# Patient Record
Sex: Female | Born: 1935 | Race: White | Hispanic: No | Marital: Married | Smoking: Never smoker
Health system: Southern US, Community
[De-identification: ages and names within clinical notes are randomized; demographics above are authoritative.]

## PROBLEM LIST (undated history)

## (undated) DIAGNOSIS — I2510 Atherosclerotic heart disease of native coronary artery without angina pectoris: Secondary | ICD-10-CM

## (undated) DIAGNOSIS — C801 Malignant (primary) neoplasm, unspecified: Secondary | ICD-10-CM

## (undated) HISTORY — PX: ABDOMINAL HYSTERECTOMY: SHX81

## (undated) HISTORY — PX: TONSILLECTOMY: SUR1361

## (undated) HISTORY — PX: MASTECTOMY: SHX3

## (undated) HISTORY — PX: APPENDECTOMY: SHX54

## (undated) HISTORY — PX: CORONARY ANGIOPLASTY: SHX604

---

## 1998-04-30 ENCOUNTER — Ambulatory Visit (HOSPITAL_BASED_OUTPATIENT_CLINIC_OR_DEPARTMENT_OTHER): Admission: RE | Admit: 1998-04-30 | Discharge: 1998-04-30 | Payer: Self-pay | Admitting: Plastic Surgery

## 2011-09-10 DIAGNOSIS — I10 Essential (primary) hypertension: Secondary | ICD-10-CM | POA: Diagnosis not present

## 2011-09-10 DIAGNOSIS — C50919 Malignant neoplasm of unspecified site of unspecified female breast: Secondary | ICD-10-CM | POA: Diagnosis not present

## 2011-09-10 DIAGNOSIS — E782 Mixed hyperlipidemia: Secondary | ICD-10-CM | POA: Diagnosis not present

## 2011-09-10 DIAGNOSIS — I2510 Atherosclerotic heart disease of native coronary artery without angina pectoris: Secondary | ICD-10-CM | POA: Diagnosis not present

## 2011-09-30 DIAGNOSIS — Z79899 Other long term (current) drug therapy: Secondary | ICD-10-CM | POA: Diagnosis not present

## 2011-09-30 DIAGNOSIS — I2510 Atherosclerotic heart disease of native coronary artery without angina pectoris: Secondary | ICD-10-CM | POA: Diagnosis not present

## 2011-09-30 DIAGNOSIS — L989 Disorder of the skin and subcutaneous tissue, unspecified: Secondary | ICD-10-CM | POA: Diagnosis not present

## 2011-09-30 DIAGNOSIS — Z9861 Coronary angioplasty status: Secondary | ICD-10-CM | POA: Diagnosis not present

## 2011-09-30 DIAGNOSIS — I10 Essential (primary) hypertension: Secondary | ICD-10-CM | POA: Diagnosis not present

## 2011-09-30 DIAGNOSIS — M810 Age-related osteoporosis without current pathological fracture: Secondary | ICD-10-CM | POA: Diagnosis not present

## 2011-09-30 DIAGNOSIS — Z853 Personal history of malignant neoplasm of breast: Secondary | ICD-10-CM | POA: Diagnosis not present

## 2011-09-30 DIAGNOSIS — Z09 Encounter for follow-up examination after completed treatment for conditions other than malignant neoplasm: Secondary | ICD-10-CM | POA: Diagnosis not present

## 2011-09-30 DIAGNOSIS — E785 Hyperlipidemia, unspecified: Secondary | ICD-10-CM | POA: Diagnosis not present

## 2011-09-30 DIAGNOSIS — Z7982 Long term (current) use of aspirin: Secondary | ICD-10-CM | POA: Diagnosis not present

## 2011-10-13 DIAGNOSIS — L821 Other seborrheic keratosis: Secondary | ICD-10-CM | POA: Diagnosis not present

## 2011-10-13 DIAGNOSIS — D485 Neoplasm of uncertain behavior of skin: Secondary | ICD-10-CM | POA: Diagnosis not present

## 2011-10-13 DIAGNOSIS — L820 Inflamed seborrheic keratosis: Secondary | ICD-10-CM | POA: Diagnosis not present

## 2011-12-27 DIAGNOSIS — I10 Essential (primary) hypertension: Secondary | ICD-10-CM | POA: Diagnosis not present

## 2011-12-27 DIAGNOSIS — E782 Mixed hyperlipidemia: Secondary | ICD-10-CM | POA: Diagnosis not present

## 2012-03-21 DIAGNOSIS — L989 Disorder of the skin and subcutaneous tissue, unspecified: Secondary | ICD-10-CM | POA: Diagnosis not present

## 2012-03-21 DIAGNOSIS — Z9889 Other specified postprocedural states: Secondary | ICD-10-CM | POA: Diagnosis not present

## 2012-03-21 DIAGNOSIS — Z853 Personal history of malignant neoplasm of breast: Secondary | ICD-10-CM | POA: Diagnosis not present

## 2012-03-21 DIAGNOSIS — E785 Hyperlipidemia, unspecified: Secondary | ICD-10-CM | POA: Diagnosis not present

## 2012-03-21 DIAGNOSIS — I2510 Atherosclerotic heart disease of native coronary artery without angina pectoris: Secondary | ICD-10-CM | POA: Diagnosis not present

## 2012-03-21 DIAGNOSIS — M810 Age-related osteoporosis without current pathological fracture: Secondary | ICD-10-CM | POA: Diagnosis not present

## 2012-03-21 DIAGNOSIS — I10 Essential (primary) hypertension: Secondary | ICD-10-CM | POA: Diagnosis not present

## 2012-03-21 DIAGNOSIS — R928 Other abnormal and inconclusive findings on diagnostic imaging of breast: Secondary | ICD-10-CM | POA: Diagnosis not present

## 2012-03-21 DIAGNOSIS — Z9861 Coronary angioplasty status: Secondary | ICD-10-CM | POA: Diagnosis not present

## 2012-03-28 ENCOUNTER — Encounter: Payer: Self-pay | Admitting: Hematology and Oncology

## 2012-03-28 DIAGNOSIS — Z170 Estrogen receptor positive status [ER+]: Secondary | ICD-10-CM

## 2012-03-28 DIAGNOSIS — M810 Age-related osteoporosis without current pathological fracture: Secondary | ICD-10-CM

## 2012-03-28 DIAGNOSIS — I10 Essential (primary) hypertension: Secondary | ICD-10-CM | POA: Diagnosis not present

## 2012-03-28 DIAGNOSIS — C50919 Malignant neoplasm of unspecified site of unspecified female breast: Secondary | ICD-10-CM

## 2012-03-30 DIAGNOSIS — I10 Essential (primary) hypertension: Secondary | ICD-10-CM | POA: Diagnosis not present

## 2012-05-15 DIAGNOSIS — Z23 Encounter for immunization: Secondary | ICD-10-CM | POA: Diagnosis not present

## 2012-10-10 DIAGNOSIS — E782 Mixed hyperlipidemia: Secondary | ICD-10-CM | POA: Diagnosis not present

## 2012-10-10 DIAGNOSIS — I10 Essential (primary) hypertension: Secondary | ICD-10-CM | POA: Diagnosis not present

## 2012-10-25 ENCOUNTER — Encounter: Payer: Medicare Other | Admitting: Internal Medicine

## 2012-10-25 DIAGNOSIS — M810 Age-related osteoporosis without current pathological fracture: Secondary | ICD-10-CM | POA: Diagnosis not present

## 2012-10-25 DIAGNOSIS — C50919 Malignant neoplasm of unspecified site of unspecified female breast: Secondary | ICD-10-CM | POA: Diagnosis not present

## 2013-01-09 DIAGNOSIS — I10 Essential (primary) hypertension: Secondary | ICD-10-CM | POA: Diagnosis not present

## 2013-04-12 DIAGNOSIS — Z0000 Encounter for general adult medical examination without abnormal findings: Secondary | ICD-10-CM | POA: Diagnosis not present

## 2013-04-12 DIAGNOSIS — I10 Essential (primary) hypertension: Secondary | ICD-10-CM | POA: Diagnosis not present

## 2013-05-04 DIAGNOSIS — Z23 Encounter for immunization: Secondary | ICD-10-CM | POA: Diagnosis not present

## 2013-07-02 DIAGNOSIS — M810 Age-related osteoporosis without current pathological fracture: Secondary | ICD-10-CM | POA: Diagnosis not present

## 2013-07-02 DIAGNOSIS — Z780 Asymptomatic menopausal state: Secondary | ICD-10-CM | POA: Diagnosis not present

## 2013-07-02 DIAGNOSIS — C50919 Malignant neoplasm of unspecified site of unspecified female breast: Secondary | ICD-10-CM | POA: Diagnosis not present

## 2013-07-02 DIAGNOSIS — Z7982 Long term (current) use of aspirin: Secondary | ICD-10-CM | POA: Diagnosis not present

## 2013-07-02 DIAGNOSIS — Z79899 Other long term (current) drug therapy: Secondary | ICD-10-CM | POA: Diagnosis not present

## 2013-08-10 DIAGNOSIS — I10 Essential (primary) hypertension: Secondary | ICD-10-CM | POA: Diagnosis not present

## 2013-11-08 DIAGNOSIS — I10 Essential (primary) hypertension: Secondary | ICD-10-CM | POA: Diagnosis not present

## 2014-01-01 DIAGNOSIS — H2510 Age-related nuclear cataract, unspecified eye: Secondary | ICD-10-CM | POA: Diagnosis not present

## 2014-01-01 DIAGNOSIS — H5210 Myopia, unspecified eye: Secondary | ICD-10-CM | POA: Diagnosis not present

## 2014-01-01 DIAGNOSIS — H40019 Open angle with borderline findings, low risk, unspecified eye: Secondary | ICD-10-CM | POA: Diagnosis not present

## 2014-01-01 DIAGNOSIS — H524 Presbyopia: Secondary | ICD-10-CM | POA: Diagnosis not present

## 2014-02-11 DIAGNOSIS — I10 Essential (primary) hypertension: Secondary | ICD-10-CM | POA: Diagnosis not present

## 2014-02-11 DIAGNOSIS — Z681 Body mass index (BMI) 19 or less, adult: Secondary | ICD-10-CM | POA: Diagnosis not present

## 2014-05-07 DIAGNOSIS — Z23 Encounter for immunization: Secondary | ICD-10-CM | POA: Diagnosis not present

## 2014-05-14 DIAGNOSIS — I10 Essential (primary) hypertension: Secondary | ICD-10-CM | POA: Diagnosis not present

## 2014-05-14 DIAGNOSIS — Z1389 Encounter for screening for other disorder: Secondary | ICD-10-CM | POA: Diagnosis not present

## 2014-05-14 DIAGNOSIS — Z0000 Encounter for general adult medical examination without abnormal findings: Secondary | ICD-10-CM | POA: Diagnosis not present

## 2014-06-21 DIAGNOSIS — T1490 Injury, unspecified: Secondary | ICD-10-CM | POA: Diagnosis not present

## 2014-06-21 DIAGNOSIS — S0181XA Laceration without foreign body of other part of head, initial encounter: Secondary | ICD-10-CM | POA: Diagnosis not present

## 2014-06-21 DIAGNOSIS — Z8589 Personal history of malignant neoplasm of other organs and systems: Secondary | ICD-10-CM | POA: Diagnosis not present

## 2014-06-21 DIAGNOSIS — I2510 Atherosclerotic heart disease of native coronary artery without angina pectoris: Secondary | ICD-10-CM | POA: Diagnosis not present

## 2014-06-21 DIAGNOSIS — Z9889 Other specified postprocedural states: Secondary | ICD-10-CM | POA: Diagnosis not present

## 2014-06-21 DIAGNOSIS — R51 Headache: Secondary | ICD-10-CM | POA: Diagnosis not present

## 2014-06-21 DIAGNOSIS — R220 Localized swelling, mass and lump, head: Secondary | ICD-10-CM | POA: Diagnosis not present

## 2014-06-21 DIAGNOSIS — W1789XA Other fall from one level to another, initial encounter: Secondary | ICD-10-CM | POA: Diagnosis not present

## 2014-06-21 DIAGNOSIS — Z9861 Coronary angioplasty status: Secondary | ICD-10-CM | POA: Diagnosis not present

## 2014-06-21 DIAGNOSIS — S199XXA Unspecified injury of neck, initial encounter: Secondary | ICD-10-CM | POA: Diagnosis not present

## 2014-06-21 DIAGNOSIS — I10 Essential (primary) hypertension: Secondary | ICD-10-CM | POA: Diagnosis not present

## 2014-06-21 DIAGNOSIS — S0190XA Unspecified open wound of unspecified part of head, initial encounter: Secondary | ICD-10-CM | POA: Diagnosis not present

## 2014-06-21 DIAGNOSIS — M542 Cervicalgia: Secondary | ICD-10-CM | POA: Diagnosis not present

## 2014-07-02 DIAGNOSIS — H40013 Open angle with borderline findings, low risk, bilateral: Secondary | ICD-10-CM | POA: Diagnosis not present

## 2014-07-02 DIAGNOSIS — H2513 Age-related nuclear cataract, bilateral: Secondary | ICD-10-CM | POA: Diagnosis not present

## 2014-07-02 DIAGNOSIS — H3531 Nonexudative age-related macular degeneration: Secondary | ICD-10-CM | POA: Diagnosis not present

## 2014-07-02 DIAGNOSIS — H524 Presbyopia: Secondary | ICD-10-CM | POA: Diagnosis not present

## 2014-09-12 DIAGNOSIS — I10 Essential (primary) hypertension: Secondary | ICD-10-CM | POA: Diagnosis not present

## 2014-09-12 DIAGNOSIS — Z131 Encounter for screening for diabetes mellitus: Secondary | ICD-10-CM | POA: Diagnosis not present

## 2014-09-13 DIAGNOSIS — H40013 Open angle with borderline findings, low risk, bilateral: Secondary | ICD-10-CM | POA: Diagnosis not present

## 2014-09-13 DIAGNOSIS — H2513 Age-related nuclear cataract, bilateral: Secondary | ICD-10-CM | POA: Diagnosis not present

## 2014-10-13 DIAGNOSIS — H25812 Combined forms of age-related cataract, left eye: Secondary | ICD-10-CM | POA: Diagnosis not present

## 2014-10-21 DIAGNOSIS — H40013 Open angle with borderline findings, low risk, bilateral: Secondary | ICD-10-CM | POA: Diagnosis not present

## 2014-10-21 DIAGNOSIS — H25812 Combined forms of age-related cataract, left eye: Secondary | ICD-10-CM | POA: Diagnosis not present

## 2014-10-21 DIAGNOSIS — H25813 Combined forms of age-related cataract, bilateral: Secondary | ICD-10-CM | POA: Diagnosis not present

## 2014-11-13 ENCOUNTER — Encounter (HOSPITAL_COMMUNITY)
Admission: RE | Admit: 2014-11-13 | Discharge: 2014-11-13 | Disposition: A | Discharge status: Home / Self Care | Payer: Medicare Other | Source: Ambulatory Visit | Attending: Ophthalmology | Admitting: Ophthalmology

## 2014-11-13 ENCOUNTER — Encounter (HOSPITAL_COMMUNITY): Payer: Self-pay

## 2014-11-13 ENCOUNTER — Other Ambulatory Visit: Payer: Self-pay

## 2014-11-13 DIAGNOSIS — Z01812 Encounter for preprocedural laboratory examination: Secondary | ICD-10-CM | POA: Insufficient documentation

## 2014-11-13 DIAGNOSIS — Z01810 Encounter for preprocedural cardiovascular examination: Secondary | ICD-10-CM | POA: Diagnosis not present

## 2014-11-13 HISTORY — DX: Malignant (primary) neoplasm, unspecified: C80.1

## 2014-11-13 HISTORY — DX: Atherosclerotic heart disease of native coronary artery without angina pectoris: I25.10

## 2014-11-13 LAB — CBC
HEMATOCRIT: 32.8 % — AB (ref 36.0–46.0)
Hemoglobin: 10.6 g/dL — ABNORMAL LOW (ref 12.0–15.0)
MCH: 30.2 pg (ref 26.0–34.0)
MCHC: 32.3 g/dL (ref 30.0–36.0)
MCV: 93.4 fL (ref 78.0–100.0)
Platelets: 249 10*3/uL (ref 150–400)
RBC: 3.51 MIL/uL — AB (ref 3.87–5.11)
RDW: 13.5 % (ref 11.5–15.5)
WBC: 8.6 10*3/uL (ref 4.0–10.5)

## 2014-11-13 LAB — BASIC METABOLIC PANEL
Anion gap: 6 (ref 5–15)
BUN: 13 mg/dL (ref 6–23)
CALCIUM: 9.2 mg/dL (ref 8.4–10.5)
CHLORIDE: 102 mmol/L (ref 96–112)
CO2: 31 mmol/L (ref 19–32)
Creatinine, Ser: 0.68 mg/dL (ref 0.50–1.10)
GFR calc non Af Amer: 74 mL/min — ABNORMAL LOW (ref >90)
GFR, EST AFRICAN AMERICAN: 86 mL/min — AB (ref >90)
GLUCOSE: 100 mg/dL — AB (ref 70–99)
POTASSIUM: 4.8 mmol/L (ref 3.5–5.1)
Sodium: 139 mmol/L (ref 135–145)

## 2014-11-13 NOTE — Patient Instructions (Signed)
[place]  11/13/2014   Your procedure is scheduled on:  11/18/2014  Report to [facility] at 6:15 AM.  Call this number if you have problems the morning of surgery: [phone]   Remember:   Do not eat food or drink liquids after midnight.   Take these medicines the morning of surgery with A SIP OF WATER: Vasotec, Metoprolol, Tamoxifen   Do not wear jewelry, make-up or nail polish.  Do not wear lotions, powders, or perfumes. You may wear deodorant.  Do not shave 48 hours prior to surgery. Men may shave face and neck.  Do not bring valuables to the hospital.  [facility] is not responsible for any belongings or valuables.               Contacts, dentures or bridgework may not be worn into surgery.  Leave suitcase in the car. After surgery it may be brought to your room.  For patients admitted to the hospital, discharge time is determined by your treatment team.               Patients discharged the day of surgery will not be allowed to drive home.  Name and phone number of your driver:   Special Instructions: N/A   Please read over the following fact sheets that you were given: Anesthesia Post-op Instructions   PATIENT INSTRUCTIONS POST-ANESTHESIA  IMMEDIATELY FOLLOWING SURGERY:  Do not drive or operate machinery for the first twenty four hours after surgery.  Do not make any important decisions for twenty four hours after surgery or while taking narcotic pain medications or sedatives.  If you develop intractable nausea and vomiting or a severe headache please notify your doctor immediately.  FOLLOW-UP:  Please make an appointment with your surgeon as instructed. You do not need to follow up with anesthesia unless specifically instructed to do so.  WOUND CARE INSTRUCTIONS (if applicable):  Keep a dry clean dressing on the anesthesia/puncture wound site if there is drainage.  Once the wound has quit draining you may leave it open to air.  Generally you should leave the  bandage intact for twenty four hours unless there is drainage.  If the epidural site drains for more than 36-48 hours please call the anesthesia department.  QUESTIONS?:  Please feel free to call your physician or the hospital operator if you have any questions, and they will be happy to assist you.      Cataract Surgery  A cataract is a clouding of the lens of the eye. When a lens becomes cloudy, vision is reduced based on the degree and nature of the clouding. Surgery may be needed to improve vision. Surgery removes the cloudy lens and usually replaces it with a substitute lens (intraocular lens, IOL). LET YOUR EYE DOCTOR KNOW ABOUT:  Allergies to food or medicine.  Medicines taken including herbs, eye drops, over-the-counter medicines, and creams.  Use of steroids (by mouth or creams).  Previous problems with anesthetics or numbing medicine.  History of bleeding problems or blood clots.  Previous surgery.  Other health problems, including diabetes and kidney problems.  Possibility of pregnancy, if this applies. RISKS AND COMPLICATIONS  Infection.  Inflammation of the eyeball (endophthalmitis) that can spread to both eyes (sympathetic ophthalmia).  Poor wound healing.  If an IOL is inserted, it can later fall out of proper position. This is very uncommon.  Clouding of the part of your eye that holds an IOL in place. This is called an "  after-cataract." These are uncommon but easily treated. BEFORE THE PROCEDURE  Do not eat or drink anything except small amounts of water for 8 to 12 before your surgery, or as directed by your caregiver.  Unless you are told otherwise, continue any eye drops you have been prescribed.  Talk to your primary caregiver about all other medicines that you take (both prescription and nonprescription). In some cases, you may need to stop or change medicines near the time of your surgery. This is most important if you are taking blood-thinning  medicine.Do not stop medicines unless you are told to do so.  Arrange for someone to drive you to and from the procedure.  Do not put contact lenses in either eye on the day of your surgery. PROCEDURE There is more than one method for safely removing a cataract. Your doctor can explain the differences and help determine which is best for you. Phacoemulsification surgery is the most common form of cataract surgery.  An injection is given behind the eye or eye drops are given to make this a painless procedure.  A small cut (incision) is made on the edge of the clear, dome-shaped surface that covers the front of the eye (cornea).  A tiny probe is painlessly inserted into the eye. This device gives off ultrasound waves that soften and break up the cloudy center of the lens. This makes it easier for the cloudy lens to be removed by suction.  An IOL may be implanted.  The normal lens of the eye is covered by a clear capsule. Part of that capsule is intentionally left in the eye to support the IOL.  Your surgeon may or may not use stitches to close the incision. There are other forms of cataract surgery that require a larger incision and stitches to close the eye. This approach is taken in cases where the doctor feels that the cataract cannot be easily removed using phacoemulsification. AFTER THE PROCEDURE  When an IOL is implanted, it does not need care. It becomes a permanent part of your eye and cannot be seen or felt.  Your doctor will schedule follow-up exams to check on your progress.  Review your other medicines with your doctor to see which can be resumed after surgery.  Use eye drops or take medicine as prescribed by your doctor. Document Released: 07/01/2011 Document Revised: 11/26/2013 Document Reviewed: 07/01/2011 [facility] Patient Information 2015 [place], [place]. This information is not intended to replace advice given to you by your health care provider. Make sure you discuss  any questions you have with your health care provider.

## 2014-11-15 MED ORDER — NEOMYCIN-POLYMYXIN-DEXAMETH 3.5-10000-0.1 OP SUSP
OPHTHALMIC | Status: AC
  Filled 2014-11-15: qty 5

## 2014-11-15 MED ORDER — CYCLOPENTOLATE-PHENYLEPHRINE OP SOLN OPTIME - NO CHARGE
OPHTHALMIC | Status: AC
  Filled 2014-11-15: qty 2

## 2014-11-15 MED ORDER — KETOROLAC TROMETHAMINE 0.5 % OP SOLN: 1.0000 [drp] | OPHTHALMIC | Status: AC

## 2014-11-15 MED ORDER — PHENYLEPHRINE HCL 2.5 % OP SOLN
OPHTHALMIC | Status: AC
  Filled 2014-11-15: qty 15

## 2014-11-15 MED ORDER — LIDOCAINE HCL 3.5 % OP GEL
OPHTHALMIC | Status: AC
  Filled 2014-11-15: qty 1

## 2014-11-15 MED ORDER — LIDOCAINE HCL (PF) 1 % IJ SOLN
INTRAMUSCULAR | Status: AC
  Filled 2014-11-15: qty 2

## 2014-11-15 MED ORDER — TETRACAINE HCL 0.5 % OP SOLN
OPHTHALMIC | Status: AC
  Filled 2014-11-15: qty 2

## 2014-11-18 ENCOUNTER — Ambulatory Visit (HOSPITAL_COMMUNITY): Payer: Medicare Other | Admitting: Anesthesiology

## 2014-11-18 ENCOUNTER — Encounter (HOSPITAL_COMMUNITY): Payer: Self-pay | Admitting: *Deleted

## 2014-11-18 ENCOUNTER — Encounter (HOSPITAL_COMMUNITY)
Admission: RE | Disposition: A | Discharge status: Home / Self Care | Payer: Self-pay | Source: Ambulatory Visit | Attending: Ophthalmology

## 2014-11-18 ENCOUNTER — Ambulatory Visit (HOSPITAL_COMMUNITY)
Admission: RE | Admit: 2014-11-18 | Discharge: 2014-11-18 | Disposition: A | Discharge status: Home / Self Care | Payer: Medicare Other | Source: Ambulatory Visit | Attending: Ophthalmology | Admitting: Ophthalmology

## 2014-11-18 DIAGNOSIS — Z7982 Long term (current) use of aspirin: Secondary | ICD-10-CM | POA: Insufficient documentation

## 2014-11-18 DIAGNOSIS — Z79899 Other long term (current) drug therapy: Secondary | ICD-10-CM | POA: Diagnosis not present

## 2014-11-18 DIAGNOSIS — H25812 Combined forms of age-related cataract, left eye: Secondary | ICD-10-CM | POA: Insufficient documentation

## 2014-11-18 DIAGNOSIS — Z791 Long term (current) use of non-steroidal anti-inflammatories (NSAID): Secondary | ICD-10-CM | POA: Insufficient documentation

## 2014-11-18 DIAGNOSIS — H269 Unspecified cataract: Secondary | ICD-10-CM | POA: Diagnosis present

## 2014-11-18 DIAGNOSIS — H9190 Unspecified hearing loss, unspecified ear: Secondary | ICD-10-CM | POA: Diagnosis not present

## 2014-11-18 DIAGNOSIS — I2510 Atherosclerotic heart disease of native coronary artery without angina pectoris: Secondary | ICD-10-CM | POA: Diagnosis not present

## 2014-11-18 DIAGNOSIS — Z853 Personal history of malignant neoplasm of breast: Secondary | ICD-10-CM | POA: Insufficient documentation

## 2014-11-18 DIAGNOSIS — H259 Unspecified age-related cataract: Secondary | ICD-10-CM | POA: Diagnosis not present

## 2014-11-18 DIAGNOSIS — Z955 Presence of coronary angioplasty implant and graft: Secondary | ICD-10-CM | POA: Insufficient documentation

## 2014-11-18 DIAGNOSIS — I10 Essential (primary) hypertension: Secondary | ICD-10-CM | POA: Insufficient documentation

## 2014-11-18 HISTORY — PX: CATARACT EXTRACTION W/PHACO: SHX586

## 2014-11-18 SURGERY — PHACOEMULSIFICATION, CATARACT, WITH IOL INSERTION
Anesthesia: Monitor Anesthesia Care | Site: Eye | Laterality: Left

## 2014-11-18 MED ORDER — NEOMYCIN-POLYMYXIN-DEXAMETH 3.5-10000-0.1 OP SUSP
OPHTHALMIC | Status: DC | PRN
  Administered 2014-11-18: 2 [drp] via OPHTHALMIC

## 2014-11-18 MED ORDER — PHENYLEPHRINE HCL 2.5 % OP SOLN
1.0000 [drp] | OPHTHALMIC | Status: AC
  Administered 2014-11-18 (×3): 1 [drp] via OPHTHALMIC

## 2014-11-18 MED ORDER — EPINEPHRINE HCL 1 MG/ML IJ SOLN
INTRAMUSCULAR | Status: AC
  Filled 2014-11-18: qty 1

## 2014-11-18 MED ORDER — EPINEPHRINE HCL 1 MG/ML IJ SOLN
INTRAMUSCULAR | Status: DC | PRN
  Administered 2014-11-18: 500 mL

## 2014-11-18 MED ORDER — TETRACAINE HCL 0.5 % OP SOLN
1.0000 [drp] | OPHTHALMIC | Status: AC
  Administered 2014-11-18 (×3): 1 [drp] via OPHTHALMIC

## 2014-11-18 MED ORDER — POVIDONE-IODINE 5 % OP SOLN
OPHTHALMIC | Status: DC | PRN
  Administered 2014-11-18: 1 via OPHTHALMIC

## 2014-11-18 MED ORDER — LIDOCAINE HCL (PF) 1 % IJ SOLN
INTRAMUSCULAR | Status: DC | PRN
  Administered 2014-11-18: .4 mL

## 2014-11-18 MED ORDER — BSS IO SOLN
INTRAOCULAR | Status: DC | PRN
  Administered 2014-11-18: 15 mL

## 2014-11-18 MED ORDER — CYCLOPENTOLATE-PHENYLEPHRINE 0.2-1 % OP SOLN
1.0000 [drp] | OPHTHALMIC | Status: AC
  Administered 2014-11-18 (×3): 1 [drp] via OPHTHALMIC

## 2014-11-18 MED ORDER — MIDAZOLAM HCL 2 MG/2ML IJ SOLN
INTRAMUSCULAR | Status: AC
  Filled 2014-11-18: qty 2

## 2014-11-18 MED ORDER — LIDOCAINE HCL 3.5 % OP GEL
1.0000 "application " | Freq: Once | OPHTHALMIC | Status: AC
  Administered 2014-11-18: 1 via OPHTHALMIC

## 2014-11-18 MED ORDER — PROVISC 10 MG/ML IO SOLN
INTRAOCULAR | Status: DC | PRN
  Administered 2014-11-18: 0.85 mL via INTRAOCULAR

## 2014-11-18 MED ORDER — LACTATED RINGERS IV SOLN
INTRAVENOUS | Status: DC
  Administered 2014-11-18: 07:00:00 via INTRAVENOUS

## 2014-11-18 MED ORDER — MIDAZOLAM HCL 2 MG/2ML IJ SOLN
1.0000 mg | INTRAMUSCULAR | Status: DC | PRN
  Administered 2014-11-18: 1 mg via INTRAVENOUS

## 2014-11-18 SURGICAL SUPPLY — 33 items
CAPSULAR TENSION RING-AMO (OPHTHALMIC RELATED) IMPLANT
CLOTH BEACON ORANGE TIMEOUT ST (SAFETY) ×2 IMPLANT
EYE SHIELD UNIVERSAL CLEAR (GAUZE/BANDAGES/DRESSINGS) ×2 IMPLANT
GLOVE BIO SURGEON STRL SZ 6.5 (GLOVE) IMPLANT
GLOVE BIOGEL PI IND STRL 6.5 (GLOVE) IMPLANT
GLOVE BIOGEL PI IND STRL 7.0 (GLOVE) ×1 IMPLANT
GLOVE BIOGEL PI IND STRL 7.5 (GLOVE) IMPLANT
GLOVE BIOGEL PI INDICATOR 6.5 (GLOVE)
GLOVE BIOGEL PI INDICATOR 7.0 (GLOVE) ×1
GLOVE BIOGEL PI INDICATOR 7.5 (GLOVE)
GLOVE ECLIPSE 6.5 STRL STRAW (GLOVE) IMPLANT
GLOVE ECLIPSE 7.0 STRL STRAW (GLOVE) IMPLANT
GLOVE ECLIPSE 7.5 STRL STRAW (GLOVE) IMPLANT
GLOVE EXAM NITRILE LRG STRL (GLOVE) IMPLANT
GLOVE EXAM NITRILE MD LF STRL (GLOVE) ×2 IMPLANT
GLOVE SKINSENSE NS SZ6.5 (GLOVE)
GLOVE SKINSENSE NS SZ7.0 (GLOVE)
GLOVE SKINSENSE STRL SZ6.5 (GLOVE) IMPLANT
GLOVE SKINSENSE STRL SZ7.0 (GLOVE) IMPLANT
KIT VITRECTOMY (OPHTHALMIC RELATED) IMPLANT
PAD ARMBOARD 7.5X6 YLW CONV (MISCELLANEOUS) ×2 IMPLANT
PROC W NO LENS (INTRAOCULAR LENS)
PROC W SPEC LENS (INTRAOCULAR LENS)
PROCESS W NO LENS (INTRAOCULAR LENS) IMPLANT
PROCESS W SPEC LENS (INTRAOCULAR LENS) IMPLANT
RETRACTOR IRIS SIGHTPATH (OPHTHALMIC RELATED) IMPLANT
RING MALYGIN (MISCELLANEOUS) IMPLANT
SIGHTPATH CAT PROC W REG LENS (Ophthalmic Related) ×2 IMPLANT
SYRINGE LUER LOK 1CC (MISCELLANEOUS) ×2 IMPLANT
TAPE SURG TRANSPORE 1 IN (GAUZE/BANDAGES/DRESSINGS) ×1 IMPLANT
TAPE SURGICAL TRANSPORE 1 IN (GAUZE/BANDAGES/DRESSINGS) ×1
VISCOELASTIC ADDITIONAL (OPHTHALMIC RELATED) IMPLANT
WATER STERILE IRR 250ML POUR (IV SOLUTION) ×2 IMPLANT

## 2014-11-18 NOTE — H&P (Signed)
I have reviewed the H&P, the patient was re-examined, and I have identified no interval changes in medical condition and plan of care since the history and physical of record  

## 2014-11-18 NOTE — Anesthesia Procedure Notes (Signed)
Procedure Name: MAC Date/Time: 11/18/2014 7:18 AM Performed by: Vista Deck Pre-anesthesia Checklist: Patient identified, Emergency Drugs available, Suction available, Timeout performed and Patient being monitored Patient Re-evaluated:Patient Re-evaluated prior to inductionOxygen Delivery Method: Nasal Cannula

## 2014-11-18 NOTE — Op Note (Signed)
Date of Admission: 11/18/2014  Date of Surgery: 11/18/2014   Pre-Op Dx: Cataract Left Eye  Post-Op Dx: Senile Combined Cataract Left  Eye,  Dx Code X64.680  Surgeon: Tonny Branch, M.D.  Assistants: None  Anesthesia: Topical with MAC  Indications: Painless, progressive loss of vision with compromise of daily activities.  Surgery: Cataract Extraction with Intraocular lens Implant Left Eye  Discription: The patient had dilating drops and viscous lidocaine placed into the Left eye in the pre-op holding area. After transfer to the operating room, a time out was performed. The patient was then prepped and draped. Beginning with a 27 degree blade a paracentesis port was made at the surgeon's 2 o'clock position. The anterior chamber was then filled with 1% non-preserved lidocaine. This was followed by filling the anterior chamber with Provisc.  A 2.53mm keratome blade was used to make a clear corneal incision at the temporal limbus.  A bent cystatome needle was used to create a continuous tear capsulotomy. Hydrodissection was performed with balanced salt solution on a Fine canula. The lens nucleus was then removed using the phacoemulsification handpiece. Residual cortex was removed with the I&A handpiece. The anterior chamber and capsular bag were refilled with Provisc. A posterior chamber intraocular lens was placed into the capsular bag with it's injector. The implant was positioned with the Kuglan hook. The Provisc was then removed from the anterior chamber and capsular bag with the I&A handpiece. Stromal hydration of the main incision and paracentesis port was performed with BSS on a Fine canula. The wounds were tested for leak which was negative. The patient tolerated the procedure well. There were no operative complications. The patient was then transferred to the recovery room in stable condition.  Complications: None  Specimen: None  EBL: None  Prosthetic device: Hoya iSert 250, power 19.0 D, SN  [id number].

## 2014-11-18 NOTE — Discharge Instructions (Signed)

## 2014-11-18 NOTE — Transfer of Care (Signed)
Immediate Anesthesia Transfer of Care Note  Patient: Courtney Bender  Procedure(s) Performed: Procedure(s) (LRB): CATARACT EXTRACTION PHACO AND INTRAOCULAR LENS PLACEMENT (IOC) (Left)  Patient Location: Shortstay  Anesthesia Type: MAC  Level of Consciousness: awake  Airway & Oxygen Therapy: Patient Spontanous Breathing   Post-op Assessment: Report given to PACU RN, Post -op Vital signs reviewed and stable and Patient moving all extremities  Post vital signs: Reviewed and stable  Complications: No apparent anesthesia complications

## 2014-11-18 NOTE — Anesthesia Postprocedure Evaluation (Signed)
  Anesthesia Post-op Note  Patient: Courtney Bender  Procedure(s) Performed: Procedure(s) (LRB): CATARACT EXTRACTION PHACO AND INTRAOCULAR LENS PLACEMENT (IOC) (Left)  Patient Location:  Short Stay  Anesthesia Type: MAC  Level of Consciousness: awake  Airway and Oxygen Therapy: Patient Spontanous Breathing  Post-op Pain: none  Post-op Assessment: Post-op Vital signs reviewed, Patient's Cardiovascular Status Stable, Respiratory Function Stable, Patent Airway, No signs of Nausea or vomiting and Pain level controlled  Post-op Vital Signs: Reviewed and stable  Complications: No apparent anesthesia complications

## 2014-11-18 NOTE — Addendum Note (Signed)
Addendum  created 11/18/14 6122 by Vista Deck, CRNA   Modules edited: Anesthesia Flowsheet, Anesthesia Medication Administration

## 2014-11-18 NOTE — Addendum Note (Signed)
Addendum  created 11/18/14 9728 by Vista Deck, CRNA   Modules edited: Anesthesia Flowsheet

## 2014-11-18 NOTE — Anesthesia Preprocedure Evaluation (Signed)
Anesthesia Evaluation  Patient identified by MRN, date of birth, ID band Patient awake    Reviewed: Allergy & Precautions, NPO status , Patient's Chart, lab work & pertinent test results  Airway Mallampati: II  TM Distance: >3 FB     Dental  (+) Teeth Intact   Pulmonary neg pulmonary ROS,          Cardiovascular hypertension, + CAD and + Cardiac Stents     Neuro/Psych    GI/Hepatic negative GI ROS,   Endo/Other    Renal/GU      Musculoskeletal   Abdominal   Peds  Hematology   Anesthesia Other Findings   Reproductive/Obstetrics                             Anesthesia Physical Anesthesia Plan  ASA: III  Anesthesia Plan: MAC   Post-op Pain Management:    Induction: Intravenous  Airway Management Planned: Nasal Cannula  Additional Equipment:   Intra-op Plan:   Post-operative Plan:   Informed Consent: I have reviewed the patients History and Physical, chart, labs and discussed the procedure including the risks, benefits and alternatives for the proposed anesthesia with the patient or authorized representative who has indicated his/her understanding and acceptance.     Plan Discussed with:   Anesthesia Plan Comments:         Anesthesia Quick Evaluation

## 2014-11-19 ENCOUNTER — Encounter (HOSPITAL_COMMUNITY): Payer: Self-pay | Admitting: Ophthalmology

## 2014-11-25 DIAGNOSIS — H25811 Combined forms of age-related cataract, right eye: Secondary | ICD-10-CM | POA: Diagnosis not present

## 2014-11-25 DIAGNOSIS — H40013 Open angle with borderline findings, low risk, bilateral: Secondary | ICD-10-CM | POA: Diagnosis not present

## 2014-11-25 DIAGNOSIS — Z961 Presence of intraocular lens: Secondary | ICD-10-CM | POA: Diagnosis not present

## 2014-12-13 DIAGNOSIS — H40013 Open angle with borderline findings, low risk, bilateral: Secondary | ICD-10-CM | POA: Diagnosis not present

## 2014-12-18 ENCOUNTER — Encounter (HOSPITAL_COMMUNITY): Payer: Self-pay

## 2014-12-18 ENCOUNTER — Encounter (HOSPITAL_COMMUNITY)
Admission: RE | Admit: 2014-12-18 | Discharge: 2014-12-18 | Disposition: A | Discharge status: Home / Self Care | Payer: Medicare Other | Source: Ambulatory Visit | Attending: Ophthalmology | Admitting: Ophthalmology

## 2014-12-19 ENCOUNTER — Ambulatory Visit (HOSPITAL_COMMUNITY): Payer: Medicare Other | Admitting: Anesthesiology

## 2014-12-19 ENCOUNTER — Encounter (HOSPITAL_COMMUNITY): Payer: Self-pay | Admitting: Ophthalmology

## 2014-12-19 ENCOUNTER — Encounter (HOSPITAL_COMMUNITY)
Admission: RE | Disposition: A | Discharge status: Home / Self Care | Payer: Self-pay | Source: Ambulatory Visit | Attending: Ophthalmology

## 2014-12-19 ENCOUNTER — Ambulatory Visit (HOSPITAL_COMMUNITY)
Admission: RE | Admit: 2014-12-19 | Discharge: 2014-12-19 | Disposition: A | Discharge status: Home / Self Care | Payer: Medicare Other | Source: Ambulatory Visit | Attending: Ophthalmology | Admitting: Ophthalmology

## 2014-12-19 DIAGNOSIS — Z791 Long term (current) use of non-steroidal anti-inflammatories (NSAID): Secondary | ICD-10-CM | POA: Insufficient documentation

## 2014-12-19 DIAGNOSIS — Z7982 Long term (current) use of aspirin: Secondary | ICD-10-CM | POA: Diagnosis not present

## 2014-12-19 DIAGNOSIS — Z853 Personal history of malignant neoplasm of breast: Secondary | ICD-10-CM | POA: Insufficient documentation

## 2014-12-19 DIAGNOSIS — Z79899 Other long term (current) drug therapy: Secondary | ICD-10-CM | POA: Insufficient documentation

## 2014-12-19 DIAGNOSIS — H25811 Combined forms of age-related cataract, right eye: Secondary | ICD-10-CM | POA: Diagnosis not present

## 2014-12-19 DIAGNOSIS — H9190 Unspecified hearing loss, unspecified ear: Secondary | ICD-10-CM | POA: Insufficient documentation

## 2014-12-19 DIAGNOSIS — Z955 Presence of coronary angioplasty implant and graft: Secondary | ICD-10-CM | POA: Diagnosis not present

## 2014-12-19 DIAGNOSIS — I2510 Atherosclerotic heart disease of native coronary artery without angina pectoris: Secondary | ICD-10-CM | POA: Diagnosis not present

## 2014-12-19 DIAGNOSIS — H269 Unspecified cataract: Secondary | ICD-10-CM | POA: Diagnosis present

## 2014-12-19 DIAGNOSIS — H259 Unspecified age-related cataract: Secondary | ICD-10-CM | POA: Diagnosis not present

## 2014-12-19 DIAGNOSIS — H25812 Combined forms of age-related cataract, left eye: Secondary | ICD-10-CM | POA: Diagnosis not present

## 2014-12-19 DIAGNOSIS — I10 Essential (primary) hypertension: Secondary | ICD-10-CM | POA: Diagnosis not present

## 2014-12-19 HISTORY — PX: CATARACT EXTRACTION W/PHACO: SHX586

## 2014-12-19 SURGERY — PHACOEMULSIFICATION, CATARACT, WITH IOL INSERTION
Anesthesia: Monitor Anesthesia Care | Site: Eye | Laterality: Right

## 2014-12-19 MED ORDER — PROVISC 10 MG/ML IO SOLN
INTRAOCULAR | Status: DC | PRN
  Administered 2014-12-19: 0.85 mL via INTRAOCULAR

## 2014-12-19 MED ORDER — POVIDONE-IODINE 5 % OP SOLN
OPHTHALMIC | Status: DC | PRN
  Administered 2014-12-19: 1 via OPHTHALMIC

## 2014-12-19 MED ORDER — LIDOCAINE HCL (PF) 1 % IJ SOLN
INTRAMUSCULAR | Status: DC | PRN
  Administered 2014-12-19: .7 mL

## 2014-12-19 MED ORDER — EPINEPHRINE HCL 1 MG/ML IJ SOLN
INTRAMUSCULAR | Status: AC
  Filled 2014-12-19: qty 1

## 2014-12-19 MED ORDER — NEOMYCIN-POLYMYXIN-DEXAMETH 3.5-10000-0.1 OP SUSP
OPHTHALMIC | Status: DC | PRN
  Administered 2014-12-19: 2 [drp] via OPHTHALMIC

## 2014-12-19 MED ORDER — MIDAZOLAM HCL 2 MG/2ML IJ SOLN
1.0000 mg | INTRAMUSCULAR | Status: DC | PRN
  Administered 2014-12-19: 1 mg via INTRAVENOUS

## 2014-12-19 MED ORDER — CYCLOPENTOLATE-PHENYLEPHRINE 0.2-1 % OP SOLN
1.0000 [drp] | OPHTHALMIC | Status: AC
  Administered 2014-12-19 (×3): 1 [drp] via OPHTHALMIC

## 2014-12-19 MED ORDER — MIDAZOLAM HCL 2 MG/2ML IJ SOLN
INTRAMUSCULAR | Status: AC
  Filled 2014-12-19: qty 2

## 2014-12-19 MED ORDER — TETRACAINE HCL 0.5 % OP SOLN
1.0000 [drp] | OPHTHALMIC | Status: AC
Start: 2014-12-19 — End: 2014-12-19
  Administered 2014-12-19 (×3): 1 [drp] via OPHTHALMIC

## 2014-12-19 MED ORDER — PHENYLEPHRINE HCL 2.5 % OP SOLN
1.0000 [drp] | OPHTHALMIC | Status: AC
  Administered 2014-12-19 (×3): 1 [drp] via OPHTHALMIC

## 2014-12-19 MED ORDER — LIDOCAINE HCL 3.5 % OP GEL
1.0000 "application " | Freq: Once | OPHTHALMIC | Status: AC
  Administered 2014-12-19: 1 via OPHTHALMIC

## 2014-12-19 MED ORDER — EPINEPHRINE HCL 1 MG/ML IJ SOLN
INTRAOCULAR | Status: DC | PRN
  Administered 2014-12-19: 500 mL

## 2014-12-19 MED ORDER — BSS IO SOLN
INTRAOCULAR | Status: DC | PRN
  Administered 2014-12-19: 15 mL

## 2014-12-19 MED ORDER — LACTATED RINGERS IV SOLN
INTRAVENOUS | Status: DC
  Administered 2014-12-19: 14:00:00 via INTRAVENOUS

## 2014-12-19 SURGICAL SUPPLY — 34 items
CAPSULAR TENSION RING-AMO (OPHTHALMIC RELATED) IMPLANT
CLOTH BEACON ORANGE TIMEOUT ST (SAFETY) ×3 IMPLANT
EYE SHIELD UNIVERSAL CLEAR (GAUZE/BANDAGES/DRESSINGS) ×3 IMPLANT
GLOVE BIO SURGEON STRL SZ 6.5 (GLOVE) IMPLANT
GLOVE BIO SURGEONS STRL SZ 6.5 (GLOVE)
GLOVE BIOGEL PI IND STRL 6.5 (GLOVE) IMPLANT
GLOVE BIOGEL PI IND STRL 7.0 (GLOVE) ×2 IMPLANT
GLOVE BIOGEL PI IND STRL 7.5 (GLOVE) IMPLANT
GLOVE BIOGEL PI INDICATOR 6.5 (GLOVE)
GLOVE BIOGEL PI INDICATOR 7.0 (GLOVE) ×4
GLOVE BIOGEL PI INDICATOR 7.5 (GLOVE)
GLOVE ECLIPSE 6.5 STRL STRAW (GLOVE) IMPLANT
GLOVE ECLIPSE 7.0 STRL STRAW (GLOVE) IMPLANT
GLOVE ECLIPSE 7.5 STRL STRAW (GLOVE) IMPLANT
GLOVE EXAM NITRILE LRG STRL (GLOVE) IMPLANT
GLOVE EXAM NITRILE MD LF STRL (GLOVE) IMPLANT
GLOVE SKINSENSE NS SZ6.5 (GLOVE)
GLOVE SKINSENSE NS SZ7.0 (GLOVE)
GLOVE SKINSENSE STRL SZ6.5 (GLOVE) IMPLANT
GLOVE SKINSENSE STRL SZ7.0 (GLOVE) IMPLANT
KIT VITRECTOMY (OPHTHALMIC RELATED) IMPLANT
PAD ARMBOARD 7.5X6 YLW CONV (MISCELLANEOUS) ×3 IMPLANT
PROC W NO LENS (INTRAOCULAR LENS)
PROC W SPEC LENS (INTRAOCULAR LENS)
PROCESS W NO LENS (INTRAOCULAR LENS) IMPLANT
PROCESS W SPEC LENS (INTRAOCULAR LENS) IMPLANT
RETRACTOR IRIS SIGHTPATH (OPHTHALMIC RELATED) IMPLANT
RING MALYGIN (MISCELLANEOUS) IMPLANT
SIGHTPATH CAT PROC W REG LENS (Ophthalmic Related) ×3 IMPLANT
SYRINGE LUER LOK 1CC (MISCELLANEOUS) ×3 IMPLANT
TAPE SURG TRANSPORE 1 IN (GAUZE/BANDAGES/DRESSINGS) ×1 IMPLANT
TAPE SURGICAL TRANSPORE 1 IN (GAUZE/BANDAGES/DRESSINGS) ×2
VISCOELASTIC ADDITIONAL (OPHTHALMIC RELATED) IMPLANT
WATER STERILE IRR 250ML POUR (IV SOLUTION) ×3 IMPLANT

## 2014-12-19 NOTE — Anesthesia Postprocedure Evaluation (Signed)
  Anesthesia Post-op Note  Patient: Courtney Bender  Procedure(s) Performed: Procedure(s) with comments: CATARACT EXTRACTION PHACO AND INTRAOCULAR LENS PLACEMENT (IOC) (Right) - CDE 12.88  Patient Location: Short Stay  Anesthesia Type:MAC  Level of Consciousness: awake, alert , oriented and patient cooperative  Airway and Oxygen Therapy: Patient Spontanous Breathing  Post-op Pain: none  Post-op Assessment: Post-op Vital signs reviewed, Patient's Cardiovascular Status Stable, Respiratory Function Stable, Patent Airway, No signs of Nausea or vomiting and Pain level controlled  Post-op Vital Signs: Reviewed and stable  Last Vitals:  Filed Vitals:   12/19/14 1450  BP: 108/55  Pulse:   Temp:   Resp: 30    Complications: No apparent anesthesia complications

## 2014-12-19 NOTE — Transfer of Care (Signed)
Immediate Anesthesia Transfer of Care Note  Patient: Courtney Bender  Procedure(s) Performed: Procedure(s) with comments: CATARACT EXTRACTION PHACO AND INTRAOCULAR LENS PLACEMENT (IOC) (Right) - CDE 12.88  Patient Location: Short Stay  Anesthesia Type:MAC  Level of Consciousness: awake, alert , oriented and patient cooperative  Airway & Oxygen Therapy: Patient Spontanous Breathing  Post-op Assessment: Report given to RN and Post -op Vital signs reviewed and stable  Post vital signs: Reviewed and stable  Last Vitals:  Filed Vitals:   12/19/14 1450  BP: 108/55  Pulse:   Temp:   Resp: 30    Complications: No apparent anesthesia complications

## 2014-12-19 NOTE — Anesthesia Preprocedure Evaluation (Signed)
Anesthesia Evaluation  Patient identified by MRN, date of birth, ID band Patient awake    Reviewed: Allergy & Precautions, NPO status , Patient's Chart, lab work & pertinent test results  Airway Mallampati: II  TM Distance: >3 FB     Dental  (+) Teeth Intact   Pulmonary neg pulmonary ROS,  breath sounds clear to auscultation        Cardiovascular hypertension, Pt. on medications + CAD and + Cardiac Stents Rhythm:Regular Rate:Normal     Neuro/Psych    GI/Hepatic negative GI ROS,   Endo/Other    Renal/GU      Musculoskeletal   Abdominal   Peds  Hematology   Anesthesia Other Findings   Reproductive/Obstetrics                             Anesthesia Physical Anesthesia Plan  ASA: III  Anesthesia Plan: MAC   Post-op Pain Management:    Induction: Intravenous  Airway Management Planned: Nasal Cannula  Additional Equipment:   Intra-op Plan:   Post-operative Plan:   Informed Consent: I have reviewed the patients History and Physical, chart, labs and discussed the procedure including the risks, benefits and alternatives for the proposed anesthesia with the patient or authorized representative who has indicated his/her understanding and acceptance.     Plan Discussed with:   Anesthesia Plan Comments:         Anesthesia Quick Evaluation

## 2014-12-19 NOTE — H&P (Signed)
I have reviewed the H&P, the patient was re-examined, and I have identified no interval changes in medical condition and plan of care since the history and physical of record  

## 2014-12-19 NOTE — Anesthesia Procedure Notes (Signed)
Procedure Name: MAC Date/Time: 12/19/2014 2:51 PM Performed by: Andree Elk, Lennex Pietila A Pre-anesthesia Checklist: Patient identified, Timeout performed, Emergency Drugs available, Suction available and Patient being monitored Oxygen Delivery Method: Nasal cannula

## 2014-12-19 NOTE — Discharge Instructions (Signed)

## 2014-12-19 NOTE — Op Note (Signed)
Date of Admission: 12/19/2014  Date of Surgery: 12/19/2014   Pre-Op Dx: Cataract Right Eye  Post-Op Dx: Senile Combined Cataract Right  Eye,  Dx Code L89.211  Surgeon: Tonny Branch, M.D.  Assistants: None  Anesthesia: Topical with MAC  Indications: Painless, progressive loss of vision with compromise of daily activities.  Surgery: Cataract Extraction with Intraocular lens Implant Right Eye  Discription: The patient had dilating drops and viscous lidocaine placed into the Right eye in the pre-op holding area. After transfer to the operating room, a time out was performed. The patient was then prepped and draped. Beginning with a 65 degree blade a paracentesis port was made at the surgeon's 2 o'clock position. The anterior chamber was then filled with 1% non-preserved lidocaine. This was followed by filling the anterior chamber with Provisc.  A 2.60mm keratome blade was used to make a clear corneal incision at the temporal limbus.  A bent cystatome needle was used to create a continuous tear capsulotomy. Hydrodissection was performed with balanced salt solution on a Fine canula. The lens nucleus was then removed using the phacoemulsification handpiece. Residual cortex was removed with the I&A handpiece. The anterior chamber and capsular bag were refilled with Provisc. A posterior chamber intraocular lens was placed into the capsular bag with it's injector. The implant was positioned with the Kuglan hook. The Provisc was then removed from the anterior chamber and capsular bag with the I&A handpiece. Stromal hydration of the main incision and paracentesis port was performed with BSS on a Fine canula. The wounds were tested for leak which was negative. The patient tolerated the procedure well. There were no operative complications. The patient was then transferred to the recovery room in stable condition.  Complications: None  Specimen: None  EBL: None  Prosthetic device: Hoya iSert 250, power 21.0  D, SN [id number].

## 2014-12-20 ENCOUNTER — Encounter (HOSPITAL_COMMUNITY): Payer: Self-pay | Admitting: Ophthalmology

## 2015-01-07 DIAGNOSIS — I10 Essential (primary) hypertension: Secondary | ICD-10-CM | POA: Diagnosis not present

## 2015-04-11 DIAGNOSIS — I10 Essential (primary) hypertension: Secondary | ICD-10-CM | POA: Diagnosis not present

## 2015-04-11 DIAGNOSIS — D0511 Intraductal carcinoma in situ of right breast: Secondary | ICD-10-CM | POA: Diagnosis not present

## 2015-04-30 ENCOUNTER — Encounter (HOSPITAL_COMMUNITY): Payer: Self-pay

## 2015-04-30 ENCOUNTER — Emergency Department (HOSPITAL_COMMUNITY)
Admission: EM | Admit: 2015-04-30 | Discharge: 2015-04-30 | Disposition: A | Discharge status: Home / Self Care | Payer: Medicare Other | Attending: Emergency Medicine | Admitting: Emergency Medicine

## 2015-04-30 ENCOUNTER — Emergency Department (HOSPITAL_COMMUNITY): Payer: Medicare Other

## 2015-04-30 DIAGNOSIS — Y998 Other external cause status: Secondary | ICD-10-CM | POA: Insufficient documentation

## 2015-04-30 DIAGNOSIS — Z853 Personal history of malignant neoplasm of breast: Secondary | ICD-10-CM | POA: Insufficient documentation

## 2015-04-30 DIAGNOSIS — S0121XA Laceration without foreign body of nose, initial encounter: Secondary | ICD-10-CM | POA: Insufficient documentation

## 2015-04-30 DIAGNOSIS — W01198A Fall on same level from slipping, tripping and stumbling with subsequent striking against other object, initial encounter: Secondary | ICD-10-CM | POA: Insufficient documentation

## 2015-04-30 DIAGNOSIS — I2510 Atherosclerotic heart disease of native coronary artery without angina pectoris: Secondary | ICD-10-CM | POA: Diagnosis not present

## 2015-04-30 DIAGNOSIS — Z9861 Coronary angioplasty status: Secondary | ICD-10-CM | POA: Diagnosis not present

## 2015-04-30 DIAGNOSIS — Z23 Encounter for immunization: Secondary | ICD-10-CM | POA: Insufficient documentation

## 2015-04-30 DIAGNOSIS — Y92410 Unspecified street and highway as the place of occurrence of the external cause: Secondary | ICD-10-CM | POA: Diagnosis not present

## 2015-04-30 DIAGNOSIS — Z7982 Long term (current) use of aspirin: Secondary | ICD-10-CM | POA: Diagnosis not present

## 2015-04-30 DIAGNOSIS — S0990XA Unspecified injury of head, initial encounter: Secondary | ICD-10-CM | POA: Diagnosis not present

## 2015-04-30 DIAGNOSIS — S098XXA Other specified injuries of head, initial encounter: Secondary | ICD-10-CM | POA: Diagnosis not present

## 2015-04-30 DIAGNOSIS — S025XXA Fracture of tooth (traumatic), initial encounter for closed fracture: Secondary | ICD-10-CM | POA: Diagnosis not present

## 2015-04-30 DIAGNOSIS — W19XXXA Unspecified fall, initial encounter: Secondary | ICD-10-CM

## 2015-04-30 DIAGNOSIS — Y9389 Activity, other specified: Secondary | ICD-10-CM | POA: Diagnosis not present

## 2015-04-30 DIAGNOSIS — Z79899 Other long term (current) drug therapy: Secondary | ICD-10-CM | POA: Insufficient documentation

## 2015-04-30 DIAGNOSIS — S0181XA Laceration without foreign body of other part of head, initial encounter: Secondary | ICD-10-CM | POA: Diagnosis not present

## 2015-04-30 DIAGNOSIS — S0992XA Unspecified injury of nose, initial encounter: Secondary | ICD-10-CM | POA: Diagnosis present

## 2015-04-30 MED ORDER — TETANUS-DIPHTH-ACELL PERTUSSIS 5-2.5-18.5 LF-MCG/0.5 IM SUSP
0.5000 mL | Freq: Once | INTRAMUSCULAR | Status: AC
  Administered 2015-04-30: 0.5 mL via INTRAMUSCULAR
  Filled 2015-04-30: qty 0.5

## 2015-04-30 MED ORDER — POVIDONE-IODINE 10 % EX SOLN
CUTANEOUS | Status: AC
  Administered 2015-04-30: 14:00:00
  Filled 2015-04-30: qty 118

## 2015-04-30 MED ORDER — LIDOCAINE HCL (PF) 1 % IJ SOLN
INTRAMUSCULAR | Status: AC
  Administered 2015-04-30: 14:00:00
  Filled 2015-04-30: qty 5

## 2015-04-30 MED ORDER — AMOXICILLIN 500 MG PO CAPS
500.0000 mg | ORAL_CAPSULE | Freq: Three times a day (TID) | ORAL | Status: AC
Start: 2015-04-30 — End: ?

## 2015-04-30 NOTE — Discharge Instructions (Signed)
Follow-up with your dentist for the 2 broken teeth you have. Follow-up with your family doctor in one week to have sutures removed in for recheck of nasal fracture. Follow-up with your glasses Dr. to get your eyeglasses looked at

## 2015-04-30 NOTE — ED Notes (Signed)
EMS reports pt tripped in a gravel drive way and hit face on a concrete slab.  Pt has abrasions and laceration to nose and knocked out 2 teeth.    Denies any LOC.  Pt alert and oriented, denies any pain.

## 2015-04-30 NOTE — ED Provider Notes (Signed)
CSN: [id number]     Arrival date & time 04/30/15  0932 History  By signing my name below, I, Terressa Koyanagi, attest that this documentation has been prepared under the direction and in the presence of Milton Ferguson, MD. Electronically Signed: Terressa Koyanagi, ED Scribe. 04/30/2015. 10:14 AM.  Chief Complaint  Patient presents with  . Fall   Patient is a 81 y.o. female presenting with fall and skin laceration. The history is provided by the patient. No language interpreter was used.  Fall This is a new problem. The current episode started 1 to 2 hours ago. The problem has not changed since onset.She has tried nothing for the symptoms.  Laceration Location:  Face Facial laceration location:  Nose Length (cm):  1cm Depth:  Through dermis Bleeding: controlled   Time since incident:  2 hours Laceration mechanism:  Fall Pain details:    Severity:  No pain Relieved by:  Nothing Worsened by:  Nothing tried Ineffective treatments:  None tried  PCP: Neale Burly, MD HPI Comments: Courtney Bender is a 81 y.o. female brought in by ambulance, with PMHx noted below, who presents to the Emergency Department complaining of a fall sustained this morning whereby pt tripped in a gravel driveway, fell and hit her face on a concrete slab. Associated Sx include abrasions and lacerations to nose and 2 broken teeth on her top right side. Pt denies LOC, neck pain, pain to BLE, abd pain, pain to BUE, or any other pain at this time.  Past Medical History  Diagnosis Date  . Coronary artery disease   . Cancer ([place])     Breast, left   Past Surgical History  Procedure Laterality Date  . Tonsillectomy    . Appendectomy    . Abdominal hysterectomy    . Coronary angioplasty    . Mastectomy Left   . Cataract extraction w/phaco Left 11/18/2014    Procedure: CATARACT EXTRACTION PHACO AND INTRAOCULAR LENS PLACEMENT (IOC);  Surgeon: Tonny Branch, MD;  Location: [facility];  Service: Ophthalmology;  Laterality: Left;   CDE 12.63  . Cataract extraction w/phaco Right 12/19/2014    Procedure: CATARACT EXTRACTION PHACO AND INTRAOCULAR LENS PLACEMENT (IOC);  Surgeon: Tonny Branch, MD;  Location: [facility];  Service: Ophthalmology;  Laterality: Right;  CDE 12.88   No family history on file. Social History  Substance Use Topics  . Smoking status: Never Smoker   . Smokeless tobacco: Never Used  . Alcohol Use: No   OB History    No data available     Review of Systems  Constitutional: Negative for appetite change and fatigue.  HENT: Negative for congestion, ear discharge and sinus pressure.   Eyes: Negative for discharge.  Respiratory: Negative for cough.   Gastrointestinal: Negative for diarrhea.  Genitourinary: Negative for frequency and hematuria.  Musculoskeletal: Negative for back pain, arthralgias and neck pain.  Skin: Positive for wound (abrasions and lacerations to nose). Negative for rash.  Neurological: Negative for seizures.  Psychiatric/Behavioral: Negative for hallucinations.   Allergies  Review of patient's allergies indicates no known allergies.  Home Medications   Prior to Admission medications   Medication Sig Start Date End Date Taking? Authorizing Provider  aspirin EC 81 MG tablet Take 81 mg by mouth daily.   Yes Historical Provider, MD  atorvastatin (LIPITOR) 10 MG tablet Take 10 mg by mouth daily. 09/12/14  Yes Historical Provider, MD  Calcium Citrate-Vitamin D (CITRACAL + D PO) Take 2 tablets by mouth 2 (two)  times daily.   Yes Historical Provider, MD  enalapril (VASOTEC) 2.5 MG tablet Take 2.5 mg by mouth daily. 09/12/14  Yes Historical Provider, MD  ENSURE (ENSURE) Take 237 mLs by mouth daily.   Yes Historical Provider, MD  furosemide (LASIX) 20 MG tablet Take 20 mg by mouth daily. 08/09/14  Yes Historical Provider, MD  metoprolol tartrate (LOPRESSOR) 25 MG tablet Take 25 mg by mouth daily. 09/12/14  Yes Historical Provider, MD  naproxen sodium (ANAPROX) 220 MG tablet Take 220 mg by  mouth daily as needed (pain).   Yes Historical Provider, MD  tamoxifen (NOLVADEX) 20 MG tablet Take 20 mg by mouth daily. 10/16/14  Yes Historical Provider, MD   Triage Vitals: BP 129/52 mmHg  Pulse 57  Temp(Src) 97.4 F (36.3 C) (Oral)  Resp 18  Ht 5' (1.524 m)  Wt 86 lb (39.009 kg)  BMI 16.80 kg/m2  SpO2 99% Physical Exam  Constitutional: She is oriented to person, place, and time. She appears well-developed.  HENT:  Head: Normocephalic.  Mouth/Throat:    Eyes: Conjunctivae and EOM are normal. No scleral icterus.  Neck: Neck supple. No thyromegaly present.  Cardiovascular: Normal rate and regular rhythm.  Exam reveals no gallop and no friction rub.   No murmur heard. Pulmonary/Chest: No stridor. She has no wheezes. She has no rales. She exhibits no tenderness.  Abdominal: She exhibits no distension. There is no tenderness. There is no rebound.  Musculoskeletal: Normal range of motion. She exhibits no edema.  Lymphadenopathy:    She has no cervical adenopathy.  Neurological: She is oriented to person, place, and time. She exhibits normal muscle tone. Coordination normal.  Skin: No rash noted. No erythema.  1 cm laceration to nose with abrasions.   Psychiatric: She has a normal mood and affect. Her behavior is normal.   ED Course  LACERATION REPAIR Date/Time: 04/30/2015 2:07 PM Performed by: Milton Ferguson Authorized by: Milton Ferguson Comments: Patient has approximately 1.5 cm worth of lacerations to her nose. The area was cleaned thoroughly with Betadine and numbed with 1% lidocaine and no epi. 4 5-0 nylon sutures were used to close laceration patient tolerated the procedure well   (including critical care time) DIAGNOSTIC STUDIES: Oxygen Saturation is 99% on ra, nl by my interpretation.    COORDINATION OF CARE: 10:06 AM: Discussed treatment plan which includes head CT and lac repair with pt at bedside; patient verbalizes understanding and agrees with treatment  plan.  Imaging Review No results found. I have personally reviewed and evaluated these images as part of my medical decision-making.  MDM   Final diagnoses:  None    Patient with fall.  Patient has 2 broken teeth she had a laceration of the proximal 1.5 cm to nose. She has fractured nose. CT of the head face and neck showed fractured nose. Patient will be given amoxicillin to prevent sinusitis and cover for infection for the laceration. She is to get her sutures out in 7 days. Patient will follow up with family doctor for laceration and follow-up with a dentist for the fractured teeth.   The chart was scribed for me under my direct supervision.  I personally performed the history, physical, and medical decision making and all procedures in the evaluation of this patient.Milton Ferguson, MD 04/30/15 [id number]

## 2015-05-20 DIAGNOSIS — Z23 Encounter for immunization: Secondary | ICD-10-CM | POA: Diagnosis not present

## 2015-07-10 DIAGNOSIS — I10 Essential (primary) hypertension: Secondary | ICD-10-CM | POA: Diagnosis not present

## 2015-07-10 DIAGNOSIS — Z1389 Encounter for screening for other disorder: Secondary | ICD-10-CM | POA: Diagnosis not present

## 2015-07-10 DIAGNOSIS — Z0000 Encounter for general adult medical examination without abnormal findings: Secondary | ICD-10-CM | POA: Diagnosis not present

## 2015-07-10 DIAGNOSIS — E784 Other hyperlipidemia: Secondary | ICD-10-CM | POA: Diagnosis not present

## 2015-07-30 DIAGNOSIS — Z79810 Long term (current) use of selective estrogen receptor modulators (SERMs): Secondary | ICD-10-CM | POA: Diagnosis not present

## 2015-07-30 DIAGNOSIS — Z7982 Long term (current) use of aspirin: Secondary | ICD-10-CM | POA: Diagnosis not present

## 2015-07-30 DIAGNOSIS — I519 Heart disease, unspecified: Secondary | ICD-10-CM | POA: Diagnosis not present

## 2015-07-30 DIAGNOSIS — Z79899 Other long term (current) drug therapy: Secondary | ICD-10-CM | POA: Diagnosis not present

## 2015-07-30 DIAGNOSIS — Z780 Asymptomatic menopausal state: Secondary | ICD-10-CM | POA: Diagnosis not present

## 2015-07-30 DIAGNOSIS — C50919 Malignant neoplasm of unspecified site of unspecified female breast: Secondary | ICD-10-CM | POA: Diagnosis not present

## 2015-07-30 DIAGNOSIS — M810 Age-related osteoporosis without current pathological fracture: Secondary | ICD-10-CM | POA: Diagnosis not present

## 2015-09-24 DIAGNOSIS — H40013 Open angle with borderline findings, low risk, bilateral: Secondary | ICD-10-CM | POA: Diagnosis not present

## 2015-10-13 DIAGNOSIS — E784 Other hyperlipidemia: Secondary | ICD-10-CM | POA: Diagnosis not present

## 2015-10-13 DIAGNOSIS — I10 Essential (primary) hypertension: Secondary | ICD-10-CM | POA: Diagnosis not present

## 2016-01-13 DIAGNOSIS — E784 Other hyperlipidemia: Secondary | ICD-10-CM | POA: Diagnosis not present

## 2016-01-13 DIAGNOSIS — M810 Age-related osteoporosis without current pathological fracture: Secondary | ICD-10-CM | POA: Diagnosis not present

## 2016-01-13 DIAGNOSIS — I10 Essential (primary) hypertension: Secondary | ICD-10-CM | POA: Diagnosis not present

## 2016-02-04 DIAGNOSIS — I10 Essential (primary) hypertension: Secondary | ICD-10-CM | POA: Diagnosis not present

## 2016-02-04 DIAGNOSIS — M810 Age-related osteoporosis without current pathological fracture: Secondary | ICD-10-CM | POA: Diagnosis not present

## 2016-02-04 DIAGNOSIS — E784 Other hyperlipidemia: Secondary | ICD-10-CM | POA: Diagnosis not present

## 2016-02-25 DIAGNOSIS — I10 Essential (primary) hypertension: Secondary | ICD-10-CM | POA: Diagnosis not present

## 2016-02-25 DIAGNOSIS — M810 Age-related osteoporosis without current pathological fracture: Secondary | ICD-10-CM | POA: Diagnosis not present

## 2016-02-25 DIAGNOSIS — E784 Other hyperlipidemia: Secondary | ICD-10-CM | POA: Diagnosis not present

## 2016-03-03 DIAGNOSIS — H6123 Impacted cerumen, bilateral: Secondary | ICD-10-CM | POA: Diagnosis not present

## 2016-03-03 DIAGNOSIS — I10 Essential (primary) hypertension: Secondary | ICD-10-CM | POA: Diagnosis not present

## 2016-03-30 DIAGNOSIS — E784 Other hyperlipidemia: Secondary | ICD-10-CM | POA: Diagnosis not present

## 2016-03-30 DIAGNOSIS — I10 Essential (primary) hypertension: Secondary | ICD-10-CM | POA: Diagnosis not present

## 2016-03-30 DIAGNOSIS — M810 Age-related osteoporosis without current pathological fracture: Secondary | ICD-10-CM | POA: Diagnosis not present

## 2016-04-13 IMAGING — CT CT MAXILLOFACIAL W/O CM
4 of 9 series · 15 of 47 positions shown, 17 images · non-contrast
Comparison: None.

CLINICAL DATA: Small.  Head injury

EXAM:
CT HEAD WITHOUT CONTRAST
CT MAXILLOFACIAL WITHOUT CONTRAST
CT CERVICAL SPINE WITHOUT CONTRAST
TECHNIQUE: Multidetector CT imaging of the head, cervical spine, and
maxillofacial structures were performed using the standard protocol
without intravenous contrast. Multiplanar CT image reconstructions
of the cervical spine and maxillofacial structures were also
generated.

[Series 7: max st sagittal · sagittal · 0.31mm/px · 2 of 83 slices shown]
[im 28/83  bone]
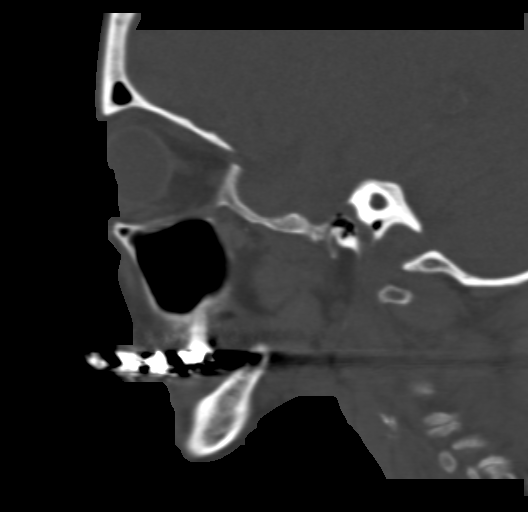
[im 55/83  bone]
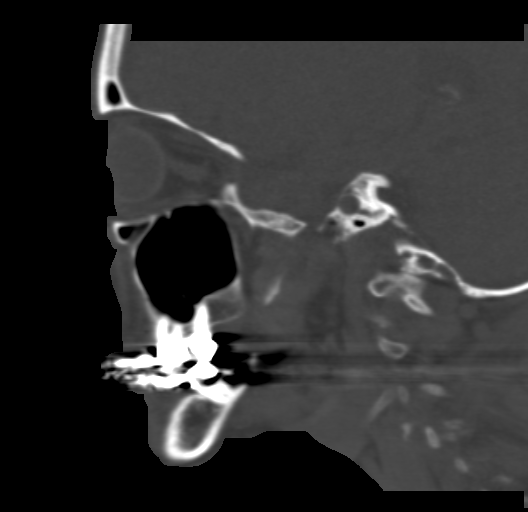

[Series 11: cervical st 2.0 b31s · axial · 0.36mm/px · z∈[-68,+4]mm · 4 of 74 slices shown]
[im 13/74  bone]
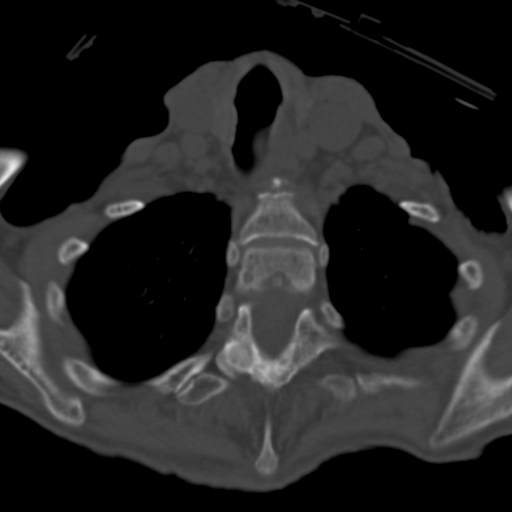
[im 25/74  bone]
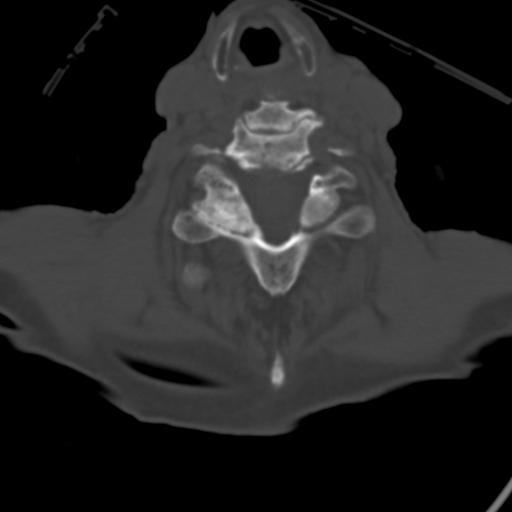
[im 37/74  bone]
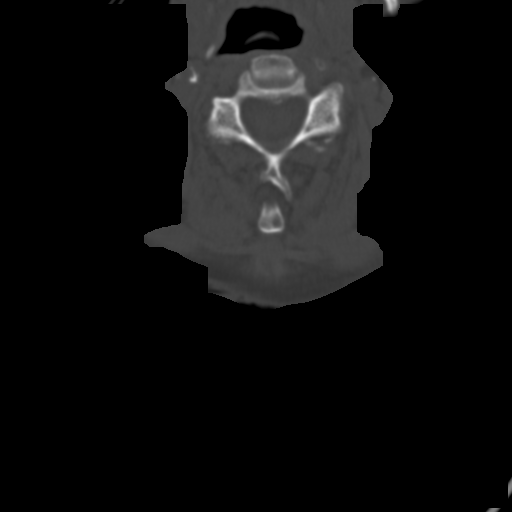
[im 49/74  bone]
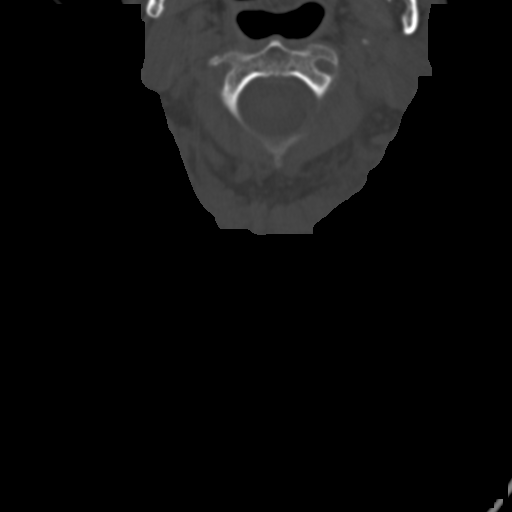

[Series 14: coronal bone 2.0 · coronal · 0.26mm/px · 1 of 76 slices shown]
[im 38/76  bone]
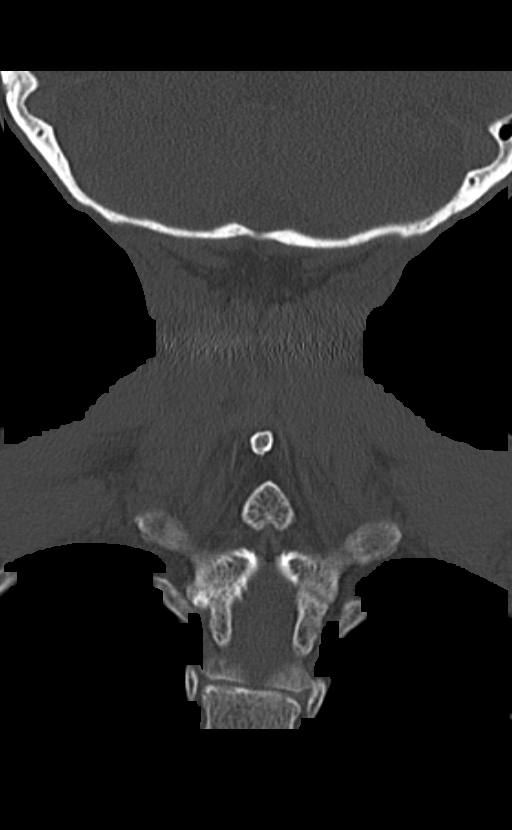

[Series 16: axial bone 2.0 · axial · 0.30mm/px · z∈[-130,+12]mm · 8 of 105 slices shown, 10 images]
[im 12/105  brain]
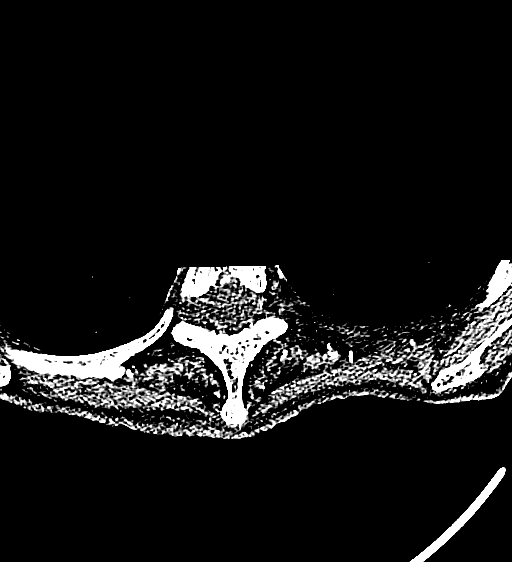
[im 12/105  bone]
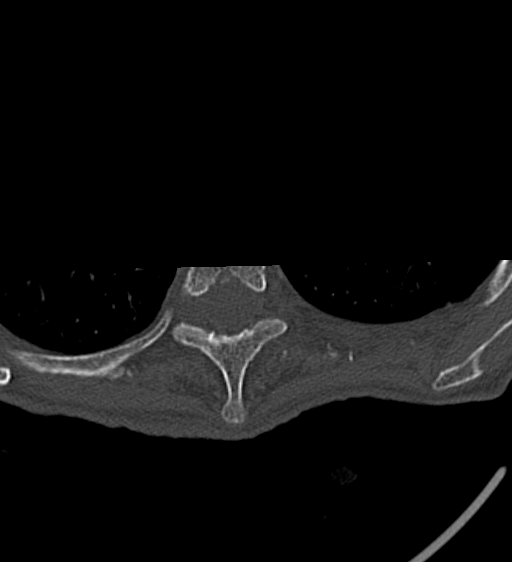
[im 24/105  bone]
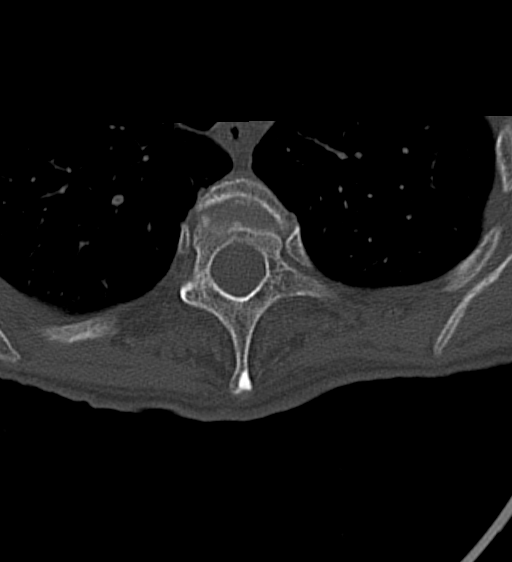
[im 35/105  bone]
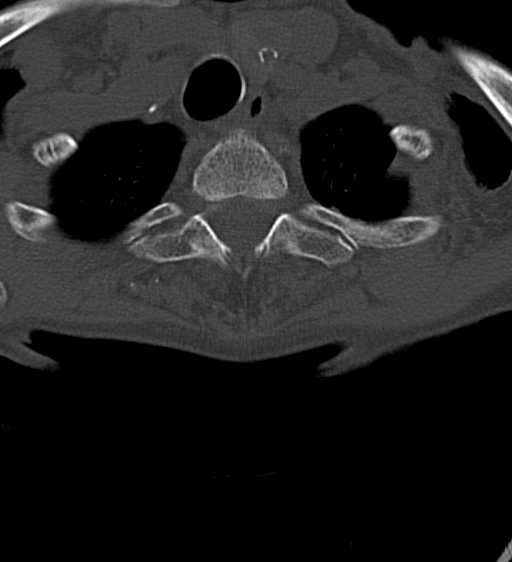
[im 47/105  bone]
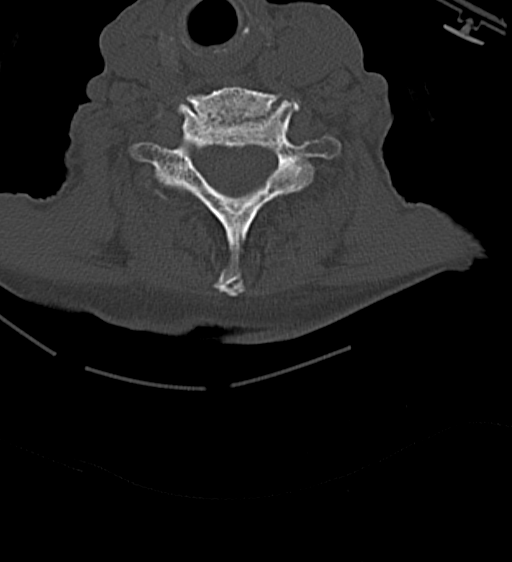
[im 58/105  brain]
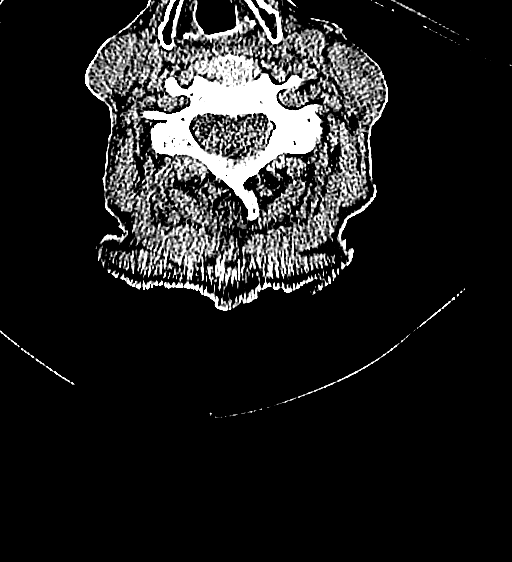
[im 58/105  bone]
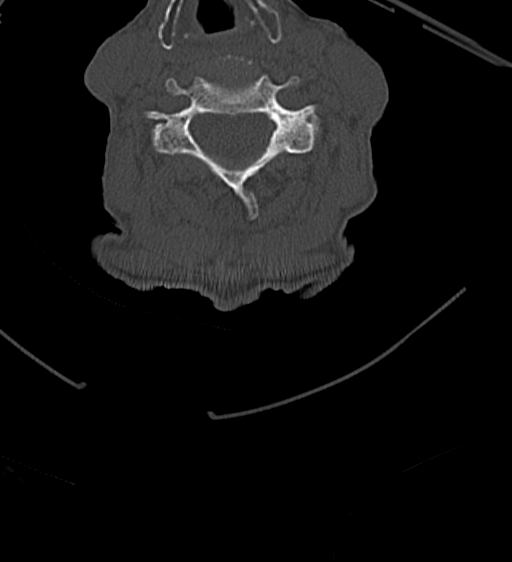
[im 70/105  bone]
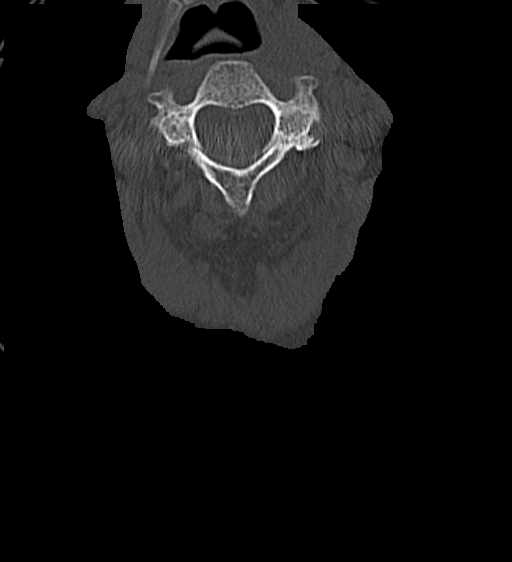
[im 81/105  bone]
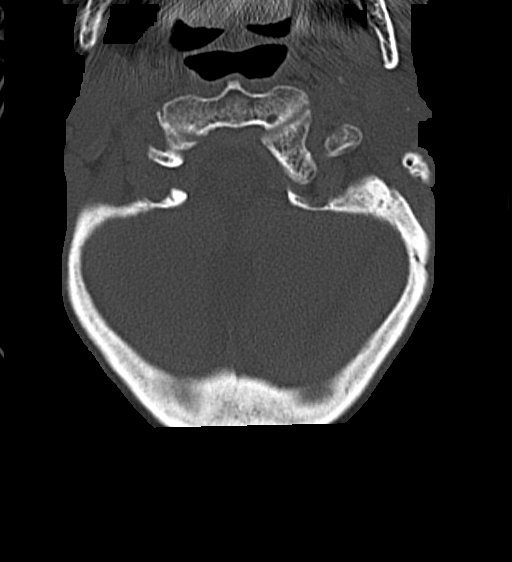
[im 93/105  bone]
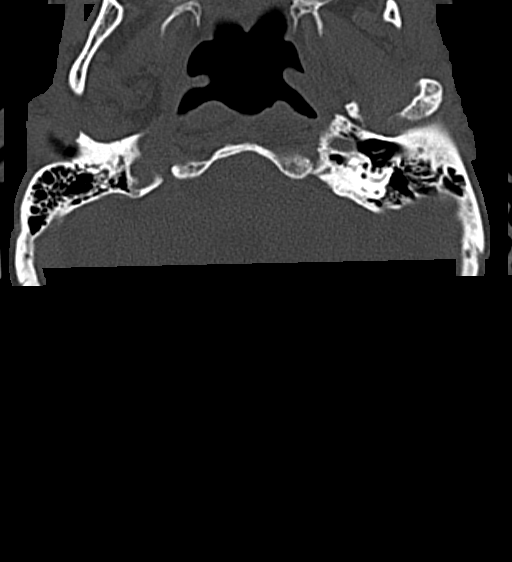

[15 of 47 positions shown; findings below may reference images not displayed]

FINDINGS: CT HEAD FINDINGS

Generalized atrophy. Negative for hydrocephalus. Benign appearing
calcification basal ganglia bilaterally

Negative for acute infarct.  Negative for hemorrhage or mass.

Negative for skull fracture. Mucous retention cyst and mucosal edema
in the sphenoid sinus.

CT MAXILLOFACIAL FINDINGS

Mildly depressed nasal bone fracture without significant
displacement. Nasal septum deviated to the right without definite
fracture. No fracture of the orbit or mandible. Degenerative change
in the TMJ bilaterally.

CT CERVICAL SPINE FINDINGS

Normal cervical alignment. Multilevel disc and facet degeneration.
Disc degeneration spondylosis most prominent at C5-6 and C6-7.

Negative for fracture

Carotid calcification. 2 cm complex mass in the left thyroid lobe
with calcification. No adenopathy
IMPRESSION: No acute intracranial abnormality

Mildly depressed nasal bone fracture.  No other facial fractures

Cervical spondylosis without fracture

Left thyroid nodule 2 cm.  Recommend thyroid ultrasound.

## 2016-05-06 DIAGNOSIS — I10 Essential (primary) hypertension: Secondary | ICD-10-CM | POA: Diagnosis not present

## 2016-05-06 DIAGNOSIS — E784 Other hyperlipidemia: Secondary | ICD-10-CM | POA: Diagnosis not present

## 2016-05-06 DIAGNOSIS — M810 Age-related osteoporosis without current pathological fracture: Secondary | ICD-10-CM | POA: Diagnosis not present

## 2016-05-07 DIAGNOSIS — E784 Other hyperlipidemia: Secondary | ICD-10-CM | POA: Diagnosis not present

## 2016-05-07 DIAGNOSIS — M810 Age-related osteoporosis without current pathological fracture: Secondary | ICD-10-CM | POA: Diagnosis not present

## 2016-05-07 DIAGNOSIS — I10 Essential (primary) hypertension: Secondary | ICD-10-CM | POA: Diagnosis not present

## 2016-05-08 DIAGNOSIS — Z23 Encounter for immunization: Secondary | ICD-10-CM | POA: Diagnosis not present

## 2016-05-31 DIAGNOSIS — E784 Other hyperlipidemia: Secondary | ICD-10-CM | POA: Diagnosis not present

## 2016-05-31 DIAGNOSIS — M810 Age-related osteoporosis without current pathological fracture: Secondary | ICD-10-CM | POA: Diagnosis not present

## 2016-05-31 DIAGNOSIS — I10 Essential (primary) hypertension: Secondary | ICD-10-CM | POA: Diagnosis not present

## 2016-06-25 DIAGNOSIS — E784 Other hyperlipidemia: Secondary | ICD-10-CM | POA: Diagnosis not present

## 2016-06-25 DIAGNOSIS — I10 Essential (primary) hypertension: Secondary | ICD-10-CM | POA: Diagnosis not present

## 2016-06-25 DIAGNOSIS — M810 Age-related osteoporosis without current pathological fracture: Secondary | ICD-10-CM | POA: Diagnosis not present

## 2016-07-28 DIAGNOSIS — M810 Age-related osteoporosis without current pathological fracture: Secondary | ICD-10-CM | POA: Diagnosis not present

## 2016-07-28 DIAGNOSIS — I10 Essential (primary) hypertension: Secondary | ICD-10-CM | POA: Diagnosis not present

## 2016-07-28 DIAGNOSIS — E784 Other hyperlipidemia: Secondary | ICD-10-CM | POA: Diagnosis not present

## 2016-08-06 DIAGNOSIS — I10 Essential (primary) hypertension: Secondary | ICD-10-CM | POA: Diagnosis not present

## 2016-08-06 DIAGNOSIS — Z1389 Encounter for screening for other disorder: Secondary | ICD-10-CM | POA: Diagnosis not present

## 2016-08-06 DIAGNOSIS — M810 Age-related osteoporosis without current pathological fracture: Secondary | ICD-10-CM | POA: Diagnosis not present

## 2016-08-06 DIAGNOSIS — Z0000 Encounter for general adult medical examination without abnormal findings: Secondary | ICD-10-CM | POA: Diagnosis not present

## 2016-08-06 DIAGNOSIS — E784 Other hyperlipidemia: Secondary | ICD-10-CM | POA: Diagnosis not present

## 2016-08-06 DIAGNOSIS — E440 Moderate protein-calorie malnutrition: Secondary | ICD-10-CM | POA: Diagnosis not present

## 2016-08-27 DIAGNOSIS — E784 Other hyperlipidemia: Secondary | ICD-10-CM | POA: Diagnosis not present

## 2016-08-27 DIAGNOSIS — M810 Age-related osteoporosis without current pathological fracture: Secondary | ICD-10-CM | POA: Diagnosis not present

## 2016-08-27 DIAGNOSIS — I10 Essential (primary) hypertension: Secondary | ICD-10-CM | POA: Diagnosis not present

## 2016-11-15 DIAGNOSIS — E440 Moderate protein-calorie malnutrition: Secondary | ICD-10-CM | POA: Diagnosis not present

## 2016-11-15 DIAGNOSIS — I10 Essential (primary) hypertension: Secondary | ICD-10-CM | POA: Diagnosis not present

## 2016-11-15 DIAGNOSIS — E784 Other hyperlipidemia: Secondary | ICD-10-CM | POA: Diagnosis not present

## 2016-11-15 DIAGNOSIS — M810 Age-related osteoporosis without current pathological fracture: Secondary | ICD-10-CM | POA: Diagnosis not present

## 2017-02-15 DIAGNOSIS — E440 Moderate protein-calorie malnutrition: Secondary | ICD-10-CM | POA: Diagnosis not present

## 2017-02-15 DIAGNOSIS — E784 Other hyperlipidemia: Secondary | ICD-10-CM | POA: Diagnosis not present

## 2017-02-15 DIAGNOSIS — I10 Essential (primary) hypertension: Secondary | ICD-10-CM | POA: Diagnosis not present

## 2017-02-15 DIAGNOSIS — M810 Age-related osteoporosis without current pathological fracture: Secondary | ICD-10-CM | POA: Diagnosis not present

## 2017-03-23 DIAGNOSIS — H905 Unspecified sensorineural hearing loss: Secondary | ICD-10-CM | POA: Diagnosis not present

## 2017-05-09 DIAGNOSIS — Z23 Encounter for immunization: Secondary | ICD-10-CM | POA: Diagnosis not present

## 2017-05-19 DIAGNOSIS — E7849 Other hyperlipidemia: Secondary | ICD-10-CM | POA: Diagnosis not present

## 2017-05-19 DIAGNOSIS — M810 Age-related osteoporosis without current pathological fracture: Secondary | ICD-10-CM | POA: Diagnosis not present

## 2017-05-19 DIAGNOSIS — I10 Essential (primary) hypertension: Secondary | ICD-10-CM | POA: Diagnosis not present

## 2017-05-19 DIAGNOSIS — E440 Moderate protein-calorie malnutrition: Secondary | ICD-10-CM | POA: Diagnosis not present

## 2017-05-23 DIAGNOSIS — E7849 Other hyperlipidemia: Secondary | ICD-10-CM | POA: Diagnosis not present

## 2017-05-23 DIAGNOSIS — I10 Essential (primary) hypertension: Secondary | ICD-10-CM | POA: Diagnosis not present

## 2017-05-23 DIAGNOSIS — M810 Age-related osteoporosis without current pathological fracture: Secondary | ICD-10-CM | POA: Diagnosis not present

## 2017-06-29 DIAGNOSIS — E7849 Other hyperlipidemia: Secondary | ICD-10-CM | POA: Diagnosis not present

## 2017-06-29 DIAGNOSIS — M810 Age-related osteoporosis without current pathological fracture: Secondary | ICD-10-CM | POA: Diagnosis not present

## 2017-06-29 DIAGNOSIS — I10 Essential (primary) hypertension: Secondary | ICD-10-CM | POA: Diagnosis not present

## 2017-06-29 DIAGNOSIS — E440 Moderate protein-calorie malnutrition: Secondary | ICD-10-CM | POA: Diagnosis not present

## 2017-07-30 DIAGNOSIS — K449 Diaphragmatic hernia without obstruction or gangrene: Secondary | ICD-10-CM | POA: Diagnosis not present

## 2017-07-30 DIAGNOSIS — R5383 Other fatigue: Secondary | ICD-10-CM | POA: Diagnosis not present

## 2017-07-30 DIAGNOSIS — R32 Unspecified urinary incontinence: Secondary | ICD-10-CM | POA: Diagnosis present

## 2017-07-30 DIAGNOSIS — M419 Scoliosis, unspecified: Secondary | ICD-10-CM | POA: Diagnosis present

## 2017-07-30 DIAGNOSIS — K559 Vascular disorder of intestine, unspecified: Secondary | ICD-10-CM | POA: Diagnosis present

## 2017-07-30 DIAGNOSIS — R579 Shock, unspecified: Secondary | ICD-10-CM | POA: Diagnosis not present

## 2017-07-30 DIAGNOSIS — K921 Melena: Secondary | ICD-10-CM | POA: Diagnosis not present

## 2017-07-30 DIAGNOSIS — R230 Cyanosis: Secondary | ICD-10-CM | POA: Diagnosis not present

## 2017-07-30 DIAGNOSIS — K5900 Constipation, unspecified: Secondary | ICD-10-CM | POA: Diagnosis not present

## 2017-07-30 DIAGNOSIS — K460 Unspecified abdominal hernia with obstruction, without gangrene: Secondary | ICD-10-CM | POA: Diagnosis not present

## 2017-07-30 DIAGNOSIS — Z66 Do not resuscitate: Secondary | ICD-10-CM | POA: Diagnosis not present

## 2017-07-30 DIAGNOSIS — Z978 Presence of other specified devices: Secondary | ICD-10-CM | POA: Diagnosis not present

## 2017-07-30 DIAGNOSIS — J189 Pneumonia, unspecified organism: Secondary | ICD-10-CM | POA: Diagnosis present

## 2017-07-30 DIAGNOSIS — I959 Hypotension, unspecified: Secondary | ICD-10-CM | POA: Diagnosis not present

## 2017-07-30 DIAGNOSIS — I10 Essential (primary) hypertension: Secondary | ICD-10-CM | POA: Diagnosis present

## 2017-07-30 DIAGNOSIS — D72829 Elevated white blood cell count, unspecified: Secondary | ICD-10-CM | POA: Diagnosis not present

## 2017-07-30 DIAGNOSIS — R9431 Abnormal electrocardiogram [ECG] [EKG]: Secondary | ICD-10-CM | POA: Diagnosis not present

## 2017-07-30 DIAGNOSIS — K469 Unspecified abdominal hernia without obstruction or gangrene: Secondary | ICD-10-CM | POA: Diagnosis not present

## 2017-07-30 DIAGNOSIS — N736 Female pelvic peritoneal adhesions (postinfective): Secondary | ICD-10-CM | POA: Diagnosis present

## 2017-07-30 DIAGNOSIS — R000 Tachycardia, unspecified: Secondary | ICD-10-CM | POA: Diagnosis not present

## 2017-07-30 DIAGNOSIS — I447 Left bundle-branch block, unspecified: Secondary | ICD-10-CM | POA: Diagnosis present

## 2017-07-30 DIAGNOSIS — Z515 Encounter for palliative care: Secondary | ICD-10-CM | POA: Diagnosis not present

## 2017-07-30 DIAGNOSIS — Z48815 Encounter for surgical aftercare following surgery on the digestive system: Secondary | ICD-10-CM | POA: Diagnosis not present

## 2017-07-30 DIAGNOSIS — R0600 Dyspnea, unspecified: Secondary | ICD-10-CM | POA: Diagnosis not present

## 2017-07-30 DIAGNOSIS — K55069 Acute infarction of intestine, part and extent unspecified: Secondary | ICD-10-CM | POA: Diagnosis not present

## 2017-07-30 DIAGNOSIS — R918 Other nonspecific abnormal finding of lung field: Secondary | ICD-10-CM | POA: Diagnosis not present

## 2017-07-30 DIAGNOSIS — E872 Acidosis: Secondary | ICD-10-CM | POA: Diagnosis not present

## 2017-07-30 DIAGNOSIS — Z48812 Encounter for surgical aftercare following surgery on the circulatory system: Secondary | ICD-10-CM | POA: Diagnosis not present

## 2017-07-30 DIAGNOSIS — N1330 Unspecified hydronephrosis: Secondary | ICD-10-CM | POA: Diagnosis present

## 2017-07-30 DIAGNOSIS — E440 Moderate protein-calorie malnutrition: Secondary | ICD-10-CM | POA: Diagnosis not present

## 2017-07-30 DIAGNOSIS — Z7189 Other specified counseling: Secondary | ICD-10-CM | POA: Diagnosis not present

## 2017-07-30 DIAGNOSIS — Z4659 Encounter for fitting and adjustment of other gastrointestinal appliance and device: Secondary | ICD-10-CM | POA: Diagnosis not present

## 2017-07-30 DIAGNOSIS — Z9049 Acquired absence of other specified parts of digestive tract: Secondary | ICD-10-CM | POA: Diagnosis not present

## 2017-07-30 DIAGNOSIS — E785 Hyperlipidemia, unspecified: Secondary | ICD-10-CM | POA: Diagnosis present

## 2017-07-30 DIAGNOSIS — I248 Other forms of acute ischemic heart disease: Secondary | ICD-10-CM | POA: Diagnosis not present

## 2017-07-30 DIAGNOSIS — D649 Anemia, unspecified: Secondary | ICD-10-CM | POA: Diagnosis not present

## 2017-07-30 DIAGNOSIS — E7800 Pure hypercholesterolemia, unspecified: Secondary | ICD-10-CM | POA: Diagnosis not present

## 2017-07-30 DIAGNOSIS — K56699 Other intestinal obstruction unspecified as to partial versus complete obstruction: Secondary | ICD-10-CM | POA: Diagnosis not present

## 2017-07-30 DIAGNOSIS — R739 Hyperglycemia, unspecified: Secondary | ICD-10-CM | POA: Diagnosis not present

## 2017-07-30 DIAGNOSIS — I2510 Atherosclerotic heart disease of native coronary artery without angina pectoris: Secondary | ICD-10-CM | POA: Diagnosis present

## 2017-07-30 DIAGNOSIS — E46 Unspecified protein-calorie malnutrition: Secondary | ICD-10-CM | POA: Diagnosis not present

## 2017-07-30 DIAGNOSIS — Z79899 Other long term (current) drug therapy: Secondary | ICD-10-CM | POA: Diagnosis not present

## 2017-07-30 DIAGNOSIS — I454 Nonspecific intraventricular block: Secondary | ICD-10-CM | POA: Diagnosis not present

## 2017-07-30 DIAGNOSIS — R1084 Generalized abdominal pain: Secondary | ICD-10-CM | POA: Diagnosis not present

## 2017-07-30 DIAGNOSIS — E876 Hypokalemia: Secondary | ICD-10-CM | POA: Diagnosis not present

## 2017-07-30 DIAGNOSIS — R7989 Other specified abnormal findings of blood chemistry: Secondary | ICD-10-CM | POA: Diagnosis not present

## 2017-07-30 DIAGNOSIS — J90 Pleural effusion, not elsewhere classified: Secondary | ICD-10-CM | POA: Diagnosis not present

## 2017-07-30 DIAGNOSIS — J984 Other disorders of lung: Secondary | ICD-10-CM | POA: Diagnosis not present

## 2017-07-30 DIAGNOSIS — R627 Adult failure to thrive: Secondary | ICD-10-CM | POA: Diagnosis present

## 2017-07-30 DIAGNOSIS — E878 Other disorders of electrolyte and fluid balance, not elsewhere classified: Secondary | ICD-10-CM | POA: Diagnosis not present

## 2017-07-30 DIAGNOSIS — I129 Hypertensive chronic kidney disease with stage 1 through stage 4 chronic kidney disease, or unspecified chronic kidney disease: Secondary | ICD-10-CM | POA: Diagnosis not present

## 2017-07-30 DIAGNOSIS — K6389 Other specified diseases of intestine: Secondary | ICD-10-CM | POA: Diagnosis not present

## 2017-07-30 DIAGNOSIS — D631 Anemia in chronic kidney disease: Secondary | ICD-10-CM | POA: Diagnosis not present

## 2017-07-30 DIAGNOSIS — K562 Volvulus: Secondary | ICD-10-CM | POA: Diagnosis not present

## 2017-07-30 DIAGNOSIS — R630 Anorexia: Secondary | ICD-10-CM | POA: Diagnosis not present

## 2017-07-30 DIAGNOSIS — Z9889 Other specified postprocedural states: Secondary | ICD-10-CM | POA: Diagnosis not present

## 2017-07-30 DIAGNOSIS — Z955 Presence of coronary angioplasty implant and graft: Secondary | ICD-10-CM | POA: Diagnosis not present

## 2017-07-30 DIAGNOSIS — J811 Chronic pulmonary edema: Secondary | ICD-10-CM | POA: Diagnosis present

## 2017-07-30 DIAGNOSIS — K668 Other specified disorders of peritoneum: Secondary | ICD-10-CM | POA: Diagnosis not present

## 2017-07-30 DIAGNOSIS — R140 Abdominal distension (gaseous): Secondary | ICD-10-CM | POA: Diagnosis not present

## 2017-07-30 DIAGNOSIS — I700 Atherosclerosis of aorta: Secondary | ICD-10-CM | POA: Diagnosis not present

## 2017-07-30 DIAGNOSIS — K558 Other vascular disorders of intestine: Secondary | ICD-10-CM | POA: Diagnosis not present

## 2017-07-30 DIAGNOSIS — Z7982 Long term (current) use of aspirin: Secondary | ICD-10-CM | POA: Diagnosis not present

## 2017-07-30 DIAGNOSIS — R52 Pain, unspecified: Secondary | ICD-10-CM | POA: Diagnosis not present

## 2017-07-30 DIAGNOSIS — I444 Left anterior fascicular block: Secondary | ICD-10-CM | POA: Diagnosis not present

## 2017-07-30 DIAGNOSIS — R188 Other ascites: Secondary | ICD-10-CM | POA: Diagnosis not present

## 2017-07-30 DIAGNOSIS — K660 Peritoneal adhesions (postprocedural) (postinfection): Secondary | ICD-10-CM | POA: Diagnosis not present

## 2017-07-30 DIAGNOSIS — K55019 Acute (reversible) ischemia of small intestine, extent unspecified: Secondary | ICD-10-CM | POA: Diagnosis not present

## 2017-07-30 DIAGNOSIS — R638 Other symptoms and signs concerning food and fluid intake: Secondary | ICD-10-CM | POA: Diagnosis not present

## 2017-07-30 DIAGNOSIS — R1030 Lower abdominal pain, unspecified: Secondary | ICD-10-CM | POA: Diagnosis not present

## 2017-07-30 DIAGNOSIS — D638 Anemia in other chronic diseases classified elsewhere: Secondary | ICD-10-CM | POA: Diagnosis present

## 2017-07-30 DIAGNOSIS — K440 Diaphragmatic hernia with obstruction, without gangrene: Secondary | ICD-10-CM | POA: Diagnosis present

## 2017-07-30 DIAGNOSIS — J810 Acute pulmonary edema: Secondary | ICD-10-CM | POA: Diagnosis not present

## 2017-07-30 DIAGNOSIS — A419 Sepsis, unspecified organism: Secondary | ICD-10-CM | POA: Diagnosis not present

## 2017-07-30 DIAGNOSIS — R109 Unspecified abdominal pain: Secondary | ICD-10-CM | POA: Diagnosis not present

## 2017-07-30 DIAGNOSIS — K567 Ileus, unspecified: Secondary | ICD-10-CM | POA: Diagnosis present

## 2017-07-30 DIAGNOSIS — K458 Other specified abdominal hernia without obstruction or gangrene: Secondary | ICD-10-CM | POA: Diagnosis not present

## 2017-07-30 DIAGNOSIS — N189 Chronic kidney disease, unspecified: Secondary | ICD-10-CM | POA: Diagnosis not present

## 2017-07-30 DIAGNOSIS — K56609 Unspecified intestinal obstruction, unspecified as to partial versus complete obstruction: Secondary | ICD-10-CM | POA: Diagnosis not present

## 2017-07-30 DIAGNOSIS — R0689 Other abnormalities of breathing: Secondary | ICD-10-CM | POA: Diagnosis not present

## 2017-08-26 DEATH — deceased
